# Patient Record
Sex: Female | Born: 1950 | Race: White | Hispanic: No | Marital: Single | Smoking: Former smoker
Health system: Southern US, Community
[De-identification: ages and names within clinical notes are randomized; demographics above are authoritative.]

## PROBLEM LIST (undated history)

## (undated) DIAGNOSIS — I10 Essential (primary) hypertension: Secondary | ICD-10-CM

## (undated) DIAGNOSIS — E7800 Pure hypercholesterolemia, unspecified: Secondary | ICD-10-CM

## (undated) HISTORY — PX: CHOLECYSTECTOMY: SHX55

---

## 2001-07-26 DIAGNOSIS — E669 Obesity, unspecified: Secondary | ICD-10-CM | POA: Insufficient documentation

## 2005-06-01 DIAGNOSIS — M159 Polyosteoarthritis, unspecified: Secondary | ICD-10-CM | POA: Insufficient documentation

## 2005-06-01 DIAGNOSIS — M5137 Other intervertebral disc degeneration, lumbosacral region: Secondary | ICD-10-CM

## 2005-06-01 DIAGNOSIS — E785 Hyperlipidemia, unspecified: Secondary | ICD-10-CM

## 2005-10-21 DIAGNOSIS — M949 Disorder of cartilage, unspecified: Secondary | ICD-10-CM

## 2005-10-21 DIAGNOSIS — M899 Disorder of bone, unspecified: Secondary | ICD-10-CM | POA: Insufficient documentation

## 2007-07-27 ENCOUNTER — Ambulatory Visit: Payer: Self-pay | Admitting: Nurse Practitioner

## 2007-07-27 DIAGNOSIS — I10 Essential (primary) hypertension: Secondary | ICD-10-CM

## 2007-07-27 DIAGNOSIS — R209 Unspecified disturbances of skin sensation: Secondary | ICD-10-CM | POA: Insufficient documentation

## 2007-07-31 LAB — CONVERTED CEMR LAB
AST: 17 units/L (ref 0–37)
BUN: 16 mg/dL (ref 6–23)
Basophils Relative: 1 % (ref 0–1)
Calcium: 9.1 mg/dL (ref 8.4–10.5)
Chloride: 109 meq/L (ref 96–112)
Cholesterol: 267 mg/dL — ABNORMAL HIGH (ref 0–200)
Creatinine, Ser: 0.69 mg/dL (ref 0.40–1.20)
Eosinophils Absolute: 0.3 10*3/uL (ref 0.0–0.7)
Eosinophils Relative: 4 % (ref 0–5)
HCT: 43 % (ref 36.0–46.0)
Hemoglobin: 13.8 g/dL (ref 12.0–15.0)
Lymphocytes Relative: 28 % (ref 12–46)
Monocytes Relative: 5 % (ref 3–12)
Potassium: 5.1 meq/L (ref 3.5–5.3)
RDW: 15.4 % (ref 11.5–15.5)
Sodium: 145 meq/L (ref 135–145)
Total Bilirubin: 0.4 mg/dL (ref 0.3–1.2)
Total Protein: 6.8 g/dL (ref 6.0–8.3)
Triglycerides: 244 mg/dL — ABNORMAL HIGH (ref <150)

## 2007-08-17 ENCOUNTER — Encounter (INDEPENDENT_AMBULATORY_CARE_PROVIDER_SITE_OTHER): Payer: Self-pay | Admitting: Nurse Practitioner

## 2007-09-14 ENCOUNTER — Ambulatory Visit: Payer: Self-pay | Admitting: Nurse Practitioner

## 2007-09-14 LAB — CONVERTED CEMR LAB
Cholesterol, target level: 200 mg/dL
LDL Goal: 130 mg/dL

## 2007-09-28 ENCOUNTER — Ambulatory Visit: Payer: Self-pay | Admitting: Nurse Practitioner

## 2007-10-17 ENCOUNTER — Encounter (INDEPENDENT_AMBULATORY_CARE_PROVIDER_SITE_OTHER): Payer: Self-pay | Admitting: Nurse Practitioner

## 2007-10-29 ENCOUNTER — Telehealth (INDEPENDENT_AMBULATORY_CARE_PROVIDER_SITE_OTHER): Payer: Self-pay | Admitting: Nurse Practitioner

## 2007-11-15 ENCOUNTER — Ambulatory Visit (HOSPITAL_COMMUNITY): Admission: RE | Admit: 2007-11-15 | Discharge: 2007-11-15 | Payer: Self-pay | Admitting: Internal Medicine

## 2007-11-16 ENCOUNTER — Encounter (INDEPENDENT_AMBULATORY_CARE_PROVIDER_SITE_OTHER): Payer: Self-pay | Admitting: Nurse Practitioner

## 2007-12-18 ENCOUNTER — Ambulatory Visit: Payer: Self-pay | Admitting: Nurse Practitioner

## 2007-12-18 DIAGNOSIS — Z9089 Acquired absence of other organs: Secondary | ICD-10-CM | POA: Insufficient documentation

## 2007-12-18 DIAGNOSIS — F17200 Nicotine dependence, unspecified, uncomplicated: Secondary | ICD-10-CM | POA: Insufficient documentation

## 2007-12-18 LAB — CONVERTED CEMR LAB
ALT: 19 units/L (ref 0–35)
AST: 17 units/L (ref 0–37)
Albumin: 4.2 g/dL (ref 3.5–5.2)
Alkaline Phosphatase: 88 units/L (ref 39–117)
HDL: 60 mg/dL (ref >39)
Total CHOL/HDL Ratio: 2.9
Total Protein: 7.1 g/dL (ref 6.0–8.3)
Triglycerides: 228 mg/dL — ABNORMAL HIGH (ref <150)

## 2007-12-20 ENCOUNTER — Encounter (INDEPENDENT_AMBULATORY_CARE_PROVIDER_SITE_OTHER): Payer: Self-pay | Admitting: Nurse Practitioner

## 2008-01-01 ENCOUNTER — Ambulatory Visit: Payer: Self-pay | Admitting: Nurse Practitioner

## 2008-04-11 ENCOUNTER — Encounter (INDEPENDENT_AMBULATORY_CARE_PROVIDER_SITE_OTHER): Payer: Self-pay | Admitting: Nurse Practitioner

## 2008-04-11 ENCOUNTER — Ambulatory Visit: Payer: Self-pay | Admitting: Nurse Practitioner

## 2008-04-11 DIAGNOSIS — Z780 Asymptomatic menopausal state: Secondary | ICD-10-CM | POA: Insufficient documentation

## 2008-04-11 LAB — CONVERTED CEMR LAB
Blood in Urine, dipstick: NEGATIVE
KOH Prep: NEGATIVE
Ketones, urine, test strip: NEGATIVE
Protein, U semiquant: NEGATIVE
Urobilinogen, UA: 0.2

## 2008-04-14 ENCOUNTER — Encounter (INDEPENDENT_AMBULATORY_CARE_PROVIDER_SITE_OTHER): Payer: Self-pay | Admitting: Nurse Practitioner

## 2008-04-15 LAB — CONVERTED CEMR LAB
ALT: 17 units/L (ref 0–35)
Basophils Absolute: 0 10*3/uL (ref 0.0–0.1)
Creatinine, Ser: 0.72 mg/dL (ref 0.40–1.20)
Glucose, Bld: 69 mg/dL — ABNORMAL LOW (ref 70–99)
Lymphocytes Relative: 25 % (ref 12–46)
MCV: 92.3 fL (ref 78.0–100.0)
Neutro Abs: 6.7 10*3/uL (ref 1.7–7.7)
Potassium: 5.1 meq/L (ref 3.5–5.3)
Total Bilirubin: 0.2 mg/dL — ABNORMAL LOW (ref 0.3–1.2)
Total CHOL/HDL Ratio: 3.8
Triglycerides: 266 mg/dL — ABNORMAL HIGH (ref <150)
VLDL: 53 mg/dL — ABNORMAL HIGH (ref 0–40)

## 2008-04-18 ENCOUNTER — Ambulatory Visit (HOSPITAL_COMMUNITY): Admission: RE | Admit: 2008-04-18 | Discharge: 2008-04-18 | Payer: Self-pay | Admitting: Internal Medicine

## 2008-04-18 ENCOUNTER — Encounter (INDEPENDENT_AMBULATORY_CARE_PROVIDER_SITE_OTHER): Payer: Self-pay | Admitting: Nurse Practitioner

## 2008-04-24 ENCOUNTER — Encounter: Admission: RE | Admit: 2008-04-24 | Discharge: 2008-04-24 | Payer: Self-pay | Admitting: Internal Medicine

## 2008-04-28 ENCOUNTER — Encounter (INDEPENDENT_AMBULATORY_CARE_PROVIDER_SITE_OTHER): Payer: Self-pay | Admitting: Internal Medicine

## 2008-04-29 ENCOUNTER — Encounter (INDEPENDENT_AMBULATORY_CARE_PROVIDER_SITE_OTHER): Payer: Self-pay | Admitting: Nurse Practitioner

## 2008-04-29 ENCOUNTER — Encounter (INDEPENDENT_AMBULATORY_CARE_PROVIDER_SITE_OTHER): Payer: Self-pay | Admitting: Diagnostic Radiology

## 2008-04-29 ENCOUNTER — Encounter: Admission: RE | Admit: 2008-04-29 | Discharge: 2008-04-29 | Payer: Self-pay | Admitting: Internal Medicine

## 2008-04-30 ENCOUNTER — Encounter (INDEPENDENT_AMBULATORY_CARE_PROVIDER_SITE_OTHER): Payer: Self-pay | Admitting: Nurse Practitioner

## 2008-07-11 ENCOUNTER — Ambulatory Visit: Payer: Self-pay | Admitting: Nurse Practitioner

## 2008-07-16 ENCOUNTER — Encounter (INDEPENDENT_AMBULATORY_CARE_PROVIDER_SITE_OTHER): Payer: Self-pay | Admitting: Nurse Practitioner

## 2008-07-17 ENCOUNTER — Ambulatory Visit: Payer: Self-pay | Admitting: Nurse Practitioner

## 2008-07-17 LAB — CONVERTED CEMR LAB
ALT: 19 units/L (ref 0–35)
Alkaline Phosphatase: 74 units/L (ref 39–117)
Cholesterol: 125 mg/dL (ref 0–200)
HDL: 52 mg/dL (ref >39)
Total CHOL/HDL Ratio: 2.4
Total Protein: 6.6 g/dL (ref 6.0–8.3)
Triglycerides: 169 mg/dL — ABNORMAL HIGH (ref <150)

## 2008-07-18 ENCOUNTER — Encounter (INDEPENDENT_AMBULATORY_CARE_PROVIDER_SITE_OTHER): Payer: Self-pay | Admitting: Nurse Practitioner

## 2008-08-25 ENCOUNTER — Encounter (INDEPENDENT_AMBULATORY_CARE_PROVIDER_SITE_OTHER): Payer: Self-pay | Admitting: Nurse Practitioner

## 2008-10-20 ENCOUNTER — Encounter: Admission: RE | Admit: 2008-10-20 | Discharge: 2008-11-27 | Payer: Self-pay | Admitting: Neurosurgery

## 2008-10-29 ENCOUNTER — Encounter (INDEPENDENT_AMBULATORY_CARE_PROVIDER_SITE_OTHER): Payer: Self-pay | Admitting: Nurse Practitioner

## 2008-11-24 ENCOUNTER — Ambulatory Visit: Payer: Self-pay | Admitting: Nurse Practitioner

## 2009-03-09 ENCOUNTER — Ambulatory Visit: Payer: Self-pay | Admitting: Nurse Practitioner

## 2009-03-09 ENCOUNTER — Telehealth (INDEPENDENT_AMBULATORY_CARE_PROVIDER_SITE_OTHER): Payer: Self-pay | Admitting: Nurse Practitioner

## 2009-03-25 ENCOUNTER — Telehealth (INDEPENDENT_AMBULATORY_CARE_PROVIDER_SITE_OTHER): Payer: Self-pay | Admitting: *Deleted

## 2009-03-31 ENCOUNTER — Encounter (INDEPENDENT_AMBULATORY_CARE_PROVIDER_SITE_OTHER): Payer: Self-pay | Admitting: Nurse Practitioner

## 2009-04-14 ENCOUNTER — Telehealth (INDEPENDENT_AMBULATORY_CARE_PROVIDER_SITE_OTHER): Payer: Self-pay | Admitting: Nurse Practitioner

## 2009-04-21 ENCOUNTER — Encounter: Admission: RE | Admit: 2009-04-21 | Discharge: 2009-04-21 | Payer: Self-pay | Admitting: Internal Medicine

## 2009-05-11 ENCOUNTER — Telehealth (INDEPENDENT_AMBULATORY_CARE_PROVIDER_SITE_OTHER): Payer: Self-pay | Admitting: *Deleted

## 2009-05-14 ENCOUNTER — Encounter (INDEPENDENT_AMBULATORY_CARE_PROVIDER_SITE_OTHER): Payer: Self-pay | Admitting: Nurse Practitioner

## 2009-07-30 ENCOUNTER — Ambulatory Visit: Payer: Self-pay | Admitting: Nurse Practitioner

## 2009-07-30 DIAGNOSIS — R05 Cough: Secondary | ICD-10-CM

## 2009-08-21 ENCOUNTER — Ambulatory Visit (HOSPITAL_COMMUNITY): Admission: RE | Admit: 2009-08-21 | Discharge: 2009-08-21 | Payer: Self-pay | Admitting: Nurse Practitioner

## 2009-08-24 ENCOUNTER — Encounter (INDEPENDENT_AMBULATORY_CARE_PROVIDER_SITE_OTHER): Payer: Self-pay | Admitting: Nurse Practitioner

## 2009-09-02 ENCOUNTER — Ambulatory Visit: Payer: Self-pay | Admitting: Nurse Practitioner

## 2009-09-03 LAB — CONVERTED CEMR LAB
AST: 17 units/L (ref 0–37)
Albumin: 4.1 g/dL (ref 3.5–5.2)
BUN: 12 mg/dL (ref 6–23)
Basophils Absolute: 0 10*3/uL (ref 0.0–0.1)
CO2: 25 meq/L (ref 19–32)
Calcium: 9.1 mg/dL (ref 8.4–10.5)
Chloride: 107 meq/L (ref 96–112)
Cholesterol: 122 mg/dL (ref 0–200)
Eosinophils Absolute: 0.4 10*3/uL (ref 0.0–0.7)
Glucose, Bld: 95 mg/dL (ref 70–99)
HDL: 52 mg/dL (ref >39)
Hemoglobin: 13.4 g/dL (ref 12.0–15.0)
LDL Cholesterol: 37 mg/dL (ref 0–99)
Lymphocytes Relative: 26 % (ref 12–46)
MCHC: 32.1 g/dL (ref 30.0–36.0)
MCV: 93.3 fL (ref 78.0–100.0)
Potassium: 4 meq/L (ref 3.5–5.3)
Total Bilirubin: 0.4 mg/dL (ref 0.3–1.2)
Total CHOL/HDL Ratio: 2.3
Total Protein: 6.5 g/dL (ref 6.0–8.3)
Triglycerides: 167 mg/dL — ABNORMAL HIGH (ref <150)

## 2009-09-18 ENCOUNTER — Telehealth (INDEPENDENT_AMBULATORY_CARE_PROVIDER_SITE_OTHER): Payer: Self-pay | Admitting: Nurse Practitioner

## 2009-09-23 ENCOUNTER — Telehealth (INDEPENDENT_AMBULATORY_CARE_PROVIDER_SITE_OTHER): Payer: Self-pay | Admitting: Nurse Practitioner

## 2009-10-19 ENCOUNTER — Telehealth (INDEPENDENT_AMBULATORY_CARE_PROVIDER_SITE_OTHER): Payer: Self-pay | Admitting: Nurse Practitioner

## 2009-10-26 ENCOUNTER — Telehealth (INDEPENDENT_AMBULATORY_CARE_PROVIDER_SITE_OTHER): Payer: Self-pay | Admitting: Nurse Practitioner

## 2009-11-01 ENCOUNTER — Encounter (INDEPENDENT_AMBULATORY_CARE_PROVIDER_SITE_OTHER): Payer: Self-pay | Admitting: Nurse Practitioner

## 2009-11-12 ENCOUNTER — Telehealth (INDEPENDENT_AMBULATORY_CARE_PROVIDER_SITE_OTHER): Payer: Self-pay | Admitting: Nurse Practitioner

## 2009-11-16 ENCOUNTER — Encounter (INDEPENDENT_AMBULATORY_CARE_PROVIDER_SITE_OTHER): Payer: Self-pay | Admitting: Nurse Practitioner

## 2009-12-24 ENCOUNTER — Ambulatory Visit: Payer: Self-pay | Admitting: Nurse Practitioner

## 2009-12-24 DIAGNOSIS — M79609 Pain in unspecified limb: Secondary | ICD-10-CM

## 2010-02-03 ENCOUNTER — Telehealth (INDEPENDENT_AMBULATORY_CARE_PROVIDER_SITE_OTHER): Payer: Self-pay | Admitting: *Deleted

## 2010-02-08 ENCOUNTER — Encounter (INDEPENDENT_AMBULATORY_CARE_PROVIDER_SITE_OTHER): Payer: Self-pay | Admitting: Nurse Practitioner

## 2010-02-10 ENCOUNTER — Telehealth (INDEPENDENT_AMBULATORY_CARE_PROVIDER_SITE_OTHER): Payer: Self-pay | Admitting: *Deleted

## 2010-02-11 ENCOUNTER — Encounter (INDEPENDENT_AMBULATORY_CARE_PROVIDER_SITE_OTHER): Payer: Self-pay | Admitting: Nurse Practitioner

## 2010-03-18 ENCOUNTER — Ambulatory Visit: Admit: 2010-03-18 | Payer: Self-pay | Admitting: Nurse Practitioner

## 2010-03-29 ENCOUNTER — Encounter: Payer: Self-pay | Admitting: Internal Medicine

## 2010-03-31 ENCOUNTER — Other Ambulatory Visit: Payer: Self-pay | Admitting: Internal Medicine

## 2010-03-31 DIAGNOSIS — Z1239 Encounter for other screening for malignant neoplasm of breast: Secondary | ICD-10-CM

## 2010-04-06 NOTE — Letter (Signed)
Summary: HANDICAPPED PLACARD  HANDICAPPED PLACARD   Imported By: Arta Bruce 05/14/2009 09:53:19  _____________________________________________________________________  External Attachment:    Type:   Image     Comment:   External Document

## 2010-04-06 NOTE — Assessment & Plan Note (Signed)
Summary: Left leg pain   Vital Signs:  Patient profile:   60 year old female Weight:      195.8 pounds BMI:     36.23 Temp:     97.8 degrees F oral Pulse rate:   80 / minute Pulse rhythm:   regular Resp:     20 per minute BP sitting:   140 / 90  (left arm) Cuff size:   large  Vitals Entered By: Levon Hedger (December 24, 2009 4:22 PM)  Nutrition Counseling: Patient's BMI is greater than 25 and therefore counseled on weight management options. CC: follow-up visit....pain in top of left thigh feel like it is being pulled, Hypertension Management, Lipid Management Is Patient Diabetic? No Pain Assessment Patient in pain? yes     Location: thigh Intensity: 8  Does patient need assistance? Functional Status Self care Ambulation Normal    CC:  follow-up visit....pain in top of left thigh feel like it is being pulled, Hypertension Management, and Lipid Management.  History of Present Illness:  Pt presents today for routine f/u. She brings in a copy of her chest x-ray films that she had back in 08/2009 and would like interpretation  Left leg pain - only on the left leg (right leg is not affected) "Feels like my leg is being pulled" Pt denies any change in activity Hx of pain in the back but not currently with this leg pain Pain progresses as the day goes on - daily for the past 5 years Pt has vicodin that she takes as needed for pain and particularly on yesterday there was no difference in the level of pain.  On other days it does help some with the pain.  Hypertension History:      She denies headache, chest pain, and palpitations.  She notes no problems with any antihypertensive medication side effects.        Positive major cardiovascular risk factors include female age 61 years old or older, hyperlipidemia, hypertension, and current tobacco user.  Negative major cardiovascular risk factors include no history of diabetes and negative family history for ischemic heart  disease.        Further assessment for target organ damage reveals no history of ASHD, cardiac end-organ damage (CHF/LVH), stroke/TIA, peripheral vascular disease, renal insufficiency, or hypertensive retinopathy.    Lipid Management History:      Positive NCEP/ATP III risk factors include female age 61 years old or older, current tobacco user, and hypertension.  Negative NCEP/ATP III risk factors include no history of early menopause without estrogen hormone replacement, non-diabetic, no family history for ischemic heart disease, no ASHD (atherosclerotic heart disease), no prior stroke/TIA, no peripheral vascular disease, and no history of aortic aneurysm.        The patient states that she knows about the "Therapeutic Lifestyle Change" diet.  Her compliance with the TLC diet is fair.  The patient does not know about adjunctive measures for cholesterol lowering.  She expresses no side effects from her lipid-lowering medication.  The patient denies any symptoms to suggest myopathy or liver disease.      Habits & Providers  Alcohol-Tobacco-Diet     Alcohol drinks/day: 0     Tobacco Status: current     Tobacco Counseling: to quit use of tobacco products     Cigarette Packs/Day: 0.75     Year Started: age 60  Exercise-Depression-Behavior     Does Patient Exercise: yes     Exercise Counseling: to improve exercise regimen  Type of exercise: walking     Exercise (avg: min/session): <30     Have you felt down or hopeless? no     Have you felt little pleasure in things? no     Depression Counseling: not indicated; screening negative for depression     Drug Use: no     Seat Belt Use: 100     Sun Exposure: occasionally  Allergies (verified): 1)  ! Sulfa 2)  ! Penicillin  Review of Systems CV:  Denies chest pain or discomfort. Resp:  Denies cough. GI:  Denies abdominal pain, nausea, and vomiting. MS:  Complains of joint pain. Neuro:  Complains of tingling.  Physical Exam  General:   alert.   Head:  normocephalic.   Eyes:  glasses Lungs:  normal breath sounds.   Heart:  normal rate and regular rhythm.   Abdomen:  normal bowel sounds.   Neurologic:  alert & oriented X3.   Skin:  color normal.   Psych:  Oriented X3.     Impression & Recommendations:  Problem # 1:  LEG PAIN, LEFT (ICD-729.5) ongoing issue for pt advised conservative therapy  Problem # 2:  HYPERTENSION, BENIGN ESSENTIAL (ICD-401.1) BP stable DASH diet Her updated medication list for this problem includes:    Amlodipine Besy-benazepril Hcl 10-20 Mg Caps (Amlodipine besy-benazepril hcl) .Marland Kitchen... 1 capsule by mouth daily for blood pressure  Problem # 3:  OBESITY (ICD-278.00) weight down 10 pounds since last visit here  Problem # 4:  TOBACCO ABUSE (ICD-305.1) ongoing  pt is still trying to quit  Complete Medication List: 1)  Amlodipine Besy-benazepril Hcl 10-20 Mg Caps (Amlodipine besy-benazepril hcl) .Marland Kitchen.. 1 capsule by mouth daily for blood pressure 2)  Naprosyn 500 Mg Tabs (Naproxen) .Marland Kitchen.. 1 tablet by mouth two times a day as needed 3)  Hydrocodone-acetaminophen 10-325 Mg Tabs (Hydrocodone-acetaminophen) .Marland Kitchen.. 1 tablet by mouth three times a day as needed for pain 4)  Calcium 600-d 600-400 Mg-unit Tabs (Calcium carbonate-vitamin d) .... One tablet by mouth two times a day for bones 5)  Zetia 10 Mg Tabs (Ezetimibe) .Marland Kitchen.. 1 tablet by mouth daily for cholesterol 6)  Co Q-10 120 Mg Caps (Coenzyme q10) .Marland Kitchen.. 1 tablet by mouth two times a day 7)  Colon Cleanser Caps (Misc natural products) .... 2 tablets by mouth two times a day 8)  Ginkgo Biloba 110-500-80 Mg Caps (Misc natural products) .... One tablet by mouth two times a day 9)  Boniva 150 Mg Tabs (Ibandronate sodium) .... One tablet by mouth monthly for bones 10)  Pravastatin Sodium 80 Mg Tabs (Pravastatin sodium) .... One tablet by mouth nightly for cholesterol  **pharmacy - d/c simvastatin**  Hypertension Assessment/Plan:      The patient's  hypertensive risk group is category B: At least one risk factor (excluding diabetes) with no target organ damage.  Her calculated 10 year risk of coronary heart disease is 13 %.  Today's blood pressure is 140/90.  Her blood pressure goal is < 140/90.  Lipid Assessment/Plan:      Based on NCEP/ATP III, the patient's risk factor category is "2 or more risk factors and a calculated 10 year CAD risk of < 20%".  The patient's lipid goals are as follows: Total cholesterol goal is 200; LDL cholesterol goal is 130; HDL cholesterol goal is 40; Triglyceride goal is 150.    Patient Instructions: 1)  You have declined the flu vaccine today.  If you change your mind then you can come back  for a nurse visit. 2)  Continue all your current medications. 3)  Left leg - apply a heating pad to your leg to see if that helps with the discomfort.   4)  Follow up in 3 months with n.martin,fnp for blood pressure and joints.   Orders Added: 1)  Est. Patient Level III [04540]    Prevention & Chronic Care Immunizations   Influenza vaccine: refused  (12/24/2009)   Influenza vaccine deferral: Refused  (03/09/2009)    Tetanus booster: 12/18/2007: Tdap    Pneumococcal vaccine: Not documented    H. zoster vaccine: Not documented  Colorectal Screening   Hemoccult: Not documented   Hemoccult action/deferral: NEG X 1 today  (04/11/2008)    Colonoscopy: Refused  (11/24/2008)   Colonoscopy action/deferral: Refused  (07/30/2009)  Other Screening   Pap smear:  Specimen Adequacy: Satisfactory for evaluation.   Interpretation/Result:Negative for intraepithelial Lesion or Malignancy.     (04/11/2008)   Pap smear action/deferral: PAP smear done  (04/11/2008)    Mammogram: ASSESSMENT: Negative - BI-RADS 1^MM DIGITAL SCREENING  (04/21/2009)   Mammogram action/deferral: mammogram ordered  (04/11/2008)    DXA bone density scan:  Lumbar Spine:  T Score< -2.5 Spine.     (04/18/2008)   Smoking status: current   (12/24/2009)   Smoking cessation counseling: yes  (04/11/2008)  Lipids   Total Cholesterol: 122  (09/02/2009)   LDL: 37  (09/02/2009)   LDL Direct: Not documented   HDL: 52  (09/02/2009)   Triglycerides: 167  (09/02/2009)    SGOT (AST): 17  (09/02/2009)   SGPT (ALT): 15  (09/02/2009)   Alkaline phosphatase: 80  (09/02/2009)   Total bilirubin: 0.4  (09/02/2009)  Hypertension   Last Blood Pressure: 140 / 90  (12/24/2009)   Serum creatinine: 0.70  (09/02/2009)   Serum potassium 4.0  (09/02/2009)  Self-Management Support :    Hypertension self-management support: Not documented    Lipid self-management support: Not documented

## 2010-04-06 NOTE — Medication Information (Signed)
Summary: SCRIPT FAXED TO [facility]  SCRIPT FAXED TO [facility]   Imported By: Arta Bruce 02/08/2010 15:43:42  _____________________________________________________________________  External Attachment:    Type:   Image     Comment:   External Document

## 2010-04-06 NOTE — Assessment & Plan Note (Signed)
Summary: HTN   Vital Signs:  Patient profile:   60 year old female Weight:      205.5 pounds BMI:     38.03 BSA:     1.93 Temp:     98.9 degrees F oral Pulse rate:   98 / minute Pulse rhythm:   regular Resp:     20 per minute BP sitting:   113 / 72  (left arm) Cuff size:   large  Vitals Entered By: Levon Hedger (Jul 30, 2009 4:02 PM)  Nutrition Counseling: Patient's BMI is greater than 25 and therefore counseled on weight management options. CC: follow-up visit...always has pain but not sure if it may be bone pain, Hypertension Management, Lipid Management Is Patient Diabetic? No  Does patient need assistance? Functional Status Self care Ambulation Normal   CC:  follow-up visit...always has pain but not sure if it may be bone pain, Hypertension Management, and Lipid Management.  History of Present Illness:  Pt into the office for follow up on htn and pain.  Pt presents today with all her medications.  Colonscopy - Spoke with pt on last visit about recommendations for colonscopy Pt was to review literature and inform provider when she was ready for scheduling. Pt admits that she has not thought about it very and is not interested.  Hypertension History:      She denies headache, chest pain, and palpitations.  She notes no problems with any antihypertensive medication side effects.  pt is taking meds as ordered.        Positive major cardiovascular risk factors include female age 8 years old or older, hyperlipidemia, hypertension, and current tobacco user.  Negative major cardiovascular risk factors include no history of diabetes and negative family history for ischemic heart disease.        Further assessment for target organ damage reveals no history of ASHD, cardiac end-organ damage (CHF/LVH), stroke/TIA, peripheral vascular disease, renal insufficiency, or hypertensive retinopathy.    Lipid Management History:      Positive NCEP/ATP III risk factors include female  age 60 years old or older, current tobacco user, and hypertension.  Negative NCEP/ATP III risk factors include no history of early menopause without estrogen hormone replacement, non-diabetic, no family history for ischemic heart disease, no ASHD (atherosclerotic heart disease), no prior stroke/TIA, no peripheral vascular disease, and no history of aortic aneurysm.        The patient states that she knows about the "Therapeutic Lifestyle Change" diet.  Her compliance with the TLC diet is fair.      Habits & Providers  Alcohol-Tobacco-Diet     Alcohol drinks/day: 0     Tobacco Status: current     Tobacco Counseling: to quit use of tobacco products     Cigarette Packs/Day: 0.75     Year Started: age 81  Exercise-Depression-Behavior     Does Patient Exercise: yes     Exercise Counseling: to improve exercise regimen     Type of exercise: walking     Exercise (avg: min/session): <30     Depression Counseling: not indicated; screening negative for depression     Drug Use: no     Seat Belt Use: 100     Sun Exposure: occasionally  Comments: Pt did take chantix for 1 month (January) and she quit smoking but she restarted about 1 month ago after peer pressure from her son.  She is interested in restarting the chantix in the attempt to stop smoking.  Allergies (  verified): 1)  ! Sulfa 2)  ! Penicillin  Review of Systems CV:  Denies chest pain or discomfort. Resp:  Denies cough. GI:  Denies abdominal pain, nausea, and vomiting. MS:  Complains of joint pain.  Physical Exam  General:  alert.   Head:  normocephalic.  long stringy hair Eyes:  glasses Lungs:  normal breath sounds.   Heart:  normal rate and regular rhythm.   Abdomen:  normal bowel sounds.   Msk:  normal ROM.   Neurologic:  alert & oriented X3.   Psych:  Oriented X3.     Impression & Recommendations:  Problem # 1:  HYPERTENSION, BENIGN ESSENTIAL (ICD-401.1) BP is stable. reinforced DASH diet Her updated medication  list for this problem includes:    Amlodipine Besy-benazepril Hcl 10-20 Mg Caps (Amlodipine besy-benazepril hcl) .Marland Kitchen... 1 capsule by mouth daily for blood pressure  Problem # 2:  OSTEOARTHRITIS, MULTIPLE JOINTS (ICD-715.89)  Her updated medication list for this problem includes:    Naprosyn 500 Mg Tabs (Naproxen) .Marland Kitchen... 1 tablet by mouth two times a day as needed    Hydrocodone-acetaminophen 10-325 Mg Tabs (Hydrocodone-acetaminophen) .Marland Kitchen... 1 tablet by mouth three times a day as needed for pain  Problem # 3:  HYPERLIPIDEMIA (ICD-272.4) pt need fasing labs advised pt to return in 1 month for recheck Her updated medication list for this problem includes:    Simvastatin 40 Mg Tabs (Simvastatin) .Marland Kitchen... 1 tablet by mouth nightly for cholesterol    Zetia 10 Mg Tabs (Ezetimibe) .Marland Kitchen... 1 tablet by mouth daily for cholesterol  Problem # 4:  TOBACCO ABUSE (ICD-305.1)  ongoing  pt admits that she was doing well until peer pressure from her live in son started her back advised pt that she really needs to quit smoking Her updated medication list for this problem includes:    Chantix Starting Month Pak 0.5 Mg X 11 & 1 Mg X 42 Tabs (Varenicline tartrate) .Marland Kitchen... Take as directed according to starter pack instructions  Orders: CXR- 2view (CXR)  Problem # 5:  OBESITY (ICD-278.00) pt has lost 2 pounds since her last visit. she is not doing any meaningful exercise but is trying to decrease portions  Problem # 6:  COUGH (ICD-786.2)  Orders: CXR- 2view (CXR)  Problem # 7:  OSTEOPENIA (ICD-733.90)  Her updated medication list for this problem includes:    Calcium 600-d 600-400 Mg-unit Tabs (Calcium carbonate-vitamin d) ..... One tablet by mouth two times a day for bones    Boniva 150 Mg Tabs (Ibandronate sodium) ..... One tablet by mouth monthly for bones  Complete Medication List: 1)  Amlodipine Besy-benazepril Hcl 10-20 Mg Caps (Amlodipine besy-benazepril hcl) .Marland Kitchen.. 1 capsule by mouth daily for blood  pressure 2)  Naprosyn 500 Mg Tabs (Naproxen) .Marland Kitchen.. 1 tablet by mouth two times a day as needed 3)  Hydrocodone-acetaminophen 10-325 Mg Tabs (Hydrocodone-acetaminophen) .Marland Kitchen.. 1 tablet by mouth three times a day as needed for pain 4)  Simvastatin 40 Mg Tabs (Simvastatin) .Marland Kitchen.. 1 tablet by mouth nightly for cholesterol 5)  Calcium 600-d 600-400 Mg-unit Tabs (Calcium carbonate-vitamin d) .... One tablet by mouth two times a day for bones 6)  Zetia 10 Mg Tabs (Ezetimibe) .Marland Kitchen.. 1 tablet by mouth daily for cholesterol 7)  Co Q-10 120 Mg Caps (Coenzyme q10) .Marland Kitchen.. 1 tablet by mouth two times a day 8)  Colon Cleanser Caps (Misc natural products) .... 2 tablets by mouth two times a day 9)  Ginkgo Biloba 110-500-80 Mg Caps (Misc  natural products) .... One tablet by mouth two times a day 10)  Chantix Starting Month Pak 0.5 Mg X 11 & 1 Mg X 42 Tabs (Varenicline tartrate) .... Take as directed according to starter pack instructions 11)  Boniva 150 Mg Tabs (Ibandronate sodium) .... One tablet by mouth monthly for bones  Hypertension Assessment/Plan:      The patient's hypertensive risk group is category B: At least one risk factor (excluding diabetes) with no target organ damage.  Her calculated 10 year risk of coronary heart disease is 6 %.  Today's blood pressure is 113/72.  Her blood pressure goal is < 140/90.  Lipid Assessment/Plan:      Based on NCEP/ATP III, the patient's risk factor category is "2 or more risk factors and a calculated 10 year CAD risk of < 20%".  The patient's lipid goals are as follows: Total cholesterol goal is 200; LDL cholesterol goal is 130; HDL cholesterol goal is 40; Triglyceride goal is 150.    Patient Instructions: 1)  Schedule an appointment for fasting labs - lipids/cmp, tsh, cbc, 2)  for 3 weeks.  Ask for chantix prescription 3)  Do not eat after midnight before this visit.  May drink water. 4)  Follow up in 4 months with n.martin,fnp Prescriptions: BONIVA 150 MG TABS  (IBANDRONATE SODIUM) One tablet by mouth monthly for bones  #1 x 11   Entered and Authorized by:   Lehman Prom FNP   Signed by:   Lehman Prom FNP on 07/30/2009   Method used:   Print then Give to Patient   RxID:   [id number] CHANTIX STARTING MONTH PAK 0.5 MG X 11 & 1 MG X 42 TABS (VARENICLINE TARTRATE) Take as directed according to starter pack instructions  #1 month qs x 0   Entered and Authorized by:   Lehman Prom FNP   Signed by:   Lehman Prom FNP on 07/30/2009   Method used:   Print then Give to Patient   RxID:   [id number]   Prevention & Chronic Care Immunizations   Influenza vaccine: declined  (12/18/2007)   Influenza vaccine deferral: Refused  (03/09/2009)    Tetanus booster: 12/18/2007: Tdap    Pneumococcal vaccine: Not documented    H. zoster vaccine: Not documented  Colorectal Screening   Hemoccult: Not documented   Hemoccult action/deferral: NEG X 1 today  (04/11/2008)    Colonoscopy: Refused  (11/24/2008)   Colonoscopy action/deferral: Refused  (07/30/2009)  Other Screening   Pap smear:  Specimen Adequacy: Satisfactory for evaluation.   Interpretation/Result:Negative for intraepithelial Lesion or Malignancy.     (04/11/2008)   Pap smear action/deferral: PAP smear done  (04/11/2008)    Mammogram: ASSESSMENT: Negative - BI-RADS 1^MM DIGITAL SCREENING  (04/21/2009)   Mammogram action/deferral: mammogram ordered  (04/11/2008)    DXA bone density scan:  Lumbar Spine:  T Score< -2.5 Spine.     (04/18/2008)   Smoking status: current  (07/30/2009)   Smoking cessation counseling: yes  (04/11/2008)  Lipids   Total Cholesterol: 125  (07/17/2008)   LDL: 39  (07/17/2008)   LDL Direct: Not documented   HDL: 52  (07/17/2008)   Triglycerides: 169  (07/17/2008)    SGOT (AST): 19  (07/17/2008)   SGPT (ALT): 19  (07/17/2008)   Alkaline phosphatase: 74  (07/17/2008)   Total bilirubin: 0.4  (07/17/2008)  Hypertension   Last Blood Pressure:  113 / 72  (07/30/2009)   Serum creatinine: 0.72  (04/11/2008)   Serum potassium  5.1  (04/11/2008)  Self-Management Support :    Hypertension self-management support: Not documented    Lipid self-management support: Not documented

## 2010-04-06 NOTE — Medication Information (Signed)
Summary: [facility] /  Faxed  [facility] /  Faxed   Imported By: Arta Bruce 02/11/2010 11:37:53  _____________________________________________________________________  External Attachment:    Type:   Image     Comment:   External Document

## 2010-04-06 NOTE — Progress Notes (Signed)
Summary: Refill of naprosyn  Phone Note Outgoing Call   Summary of Call: Do you still want her Naprosyn scheduled as prn?   We just refilled this 7/14 for #60 to be taken twice a day as needed, and it's up for refill again.  Just a query - seems that she's taken the Naprosyn two times a day scheduled.  Also, do you only want this refilled x1 only? Initial call taken by: Dutch Quint RN,  October 19, 2009 3:45 PM  Follow-up for Phone Call        yes, still want it ordered as needed but she has been instructed to take it in between her narcotics which is why she has probably been taking it so routinely. ok to fill for 4 refiils Follow-up by: Lehman Prom FNP,  October 20, 2009 8:07 AM  Additional Follow-up for Phone Call Additional follow up Details #1::        Noted.  Dutch Quint RN  October 20, 2009 9:43 AM

## 2010-04-06 NOTE — Progress Notes (Signed)
Summary: Medications side effects  Phone Note Call from Patient   Summary of Call: Tammy Kirby is at the lobby because the pharmacy told her that these two meeidation causes some side effects (amlodipine/ and simvastatin) Daphine Deutscher FNP Initial call taken by: Manon Hilding,  September 18, 2009 3:26 PM  Follow-up for Phone Call        according to pt she went to pick up her medication from the [facility] and it was a note stating for the pt to have her provider to counsel her on side effects with amlodipine and simvastatin being used together. I made a copy of note and placed on your desk. Follow-up by: Levon Hedger,  September 18, 2009 4:38 PM  Additional Follow-up for Phone Call Additional follow up Details #1::        Notify pt that there is a recent update regarding simvastatin and amlodipine There is a warning against taking doses of simvastatin greater than 20mg  with amlodipine as it may cause muscle weakness Pt is taking simvastatin 40mg  so would like to change her to another medication as she was not adequately controlled with simvastatin 20mg  so to decrease back to that dose is likely not going to be effective. It pt agrees to the medication change will d/c simvastatin and another cholesterol medication. i am aware that she is also taking zetia which alone is not effective  Additional Follow-up by: Lehman Prom FNP,  September 22, 2009 1:26 PM    Additional Follow-up for Phone Call Additional follow up Details #2::    Pt. agrees to the medication changes as suggested.   Follow-up by: Dutch Quint RN,  September 22, 2009 4:31 PM  Additional Follow-up for Phone Call Additional follow up Details #3:: Details for Additional Follow-up Action Taken: Pt to continue zetia 10mg  by mouth daily  STOP simvastatin Start pravastatin 80mg  by mouth nightly ([facility]) Continue blood pressure medications as previously ordered n.martin,fnp  September 23, 2009 8:24 AM  Pt. made aware of  medication changes and new Rx -- verbalizes understanding.   Additional Follow-up by: Dutch Quint RN,  September 23, 2009 10:54 AM  New/Updated Medications: PRAVASTATIN SODIUM 80 MG TABS (PRAVASTATIN SODIUM) One tablet by mouth nightly for cholesterol  **Pharmacy - D/C simvastatin** Prescriptions: PRAVASTATIN SODIUM 80 MG TABS (PRAVASTATIN SODIUM) One tablet by mouth nightly for cholesterol  **Pharmacy - D/C simvastatin**  #30 x 5   Entered and Authorized by:   Lehman Prom FNP   Signed by:   Lehman Prom FNP on 09/23/2009   Method used:   Electronically to        [facility]  [address] [phone]* (retail)       [address]       [place], [place]  [address]       Ph: [phone]       Fax: [phone]   RxID:   [id number]

## 2010-04-06 NOTE — Progress Notes (Signed)
Summary: DROPPED OFF FORM FOR RX  Phone Note Call from Patient Call back at Home Phone [phone]   Reason for Call: Refill Medication Summary of Call: MARTIN PT. MS Ruzich DROPPED OF HER [facility] MAIL ORDER FORM FOR HER AMLODIPINE/BENAZEPRIL TO BE FAXED ALONG WITH THE RX. FORM IS IN [facility] REFILL SHELF Initial call taken by: Leodis Rains,  February 03, 2010 3:40 PM  Follow-up for Phone Call        form completed  on shelf to fax back Follow-up by: Lehman Prom FNP,  February 08, 2010 2:12 PM  Additional Follow-up for Phone Call Additional follow up Details #1::        FAXED FORM AND SCANNED INTO EMR Additional Follow-up by: Arta Bruce,  February 08, 2010 3:39 PM

## 2010-04-06 NOTE — Progress Notes (Signed)
Summary: Pain meds  Phone Note Outgoing Call   Summary of Call: Will fill pain medication on 05/13/2009 and fax to [facility]  Initial call taken by: Lehman Prom FNP,  May 11, 2009 2:36 PM  Follow-up for Phone Call        [employer] STOPPED BY AND DROPPED OFF A HANDICAPP APPLICATION TO BE FILLED OUT. Follow-up by: Leodis Rains,  May 11, 2009 4:32 PM  Additional Follow-up for Phone Call Additional follow up Details #1::        Rx faxed to [facility] [address] Levon Hedger  May 12, 2009 10:49 AM     Additional Follow-up for Phone Call Additional follow up Details #2::    notify pt that one of the forms completed - which was the one she put her information in she can pick up at her convience Follow-up by: Lehman Prom FNP,  May 13, 2009 7:12 PM  Additional Follow-up for Phone Call Additional follow up Details #3:: Details for Additional Follow-up Action Taken: CALLED PT READY FOR PICK UP Additional Follow-up by: Arta Bruce,  May 14, 2009 9:58 AM

## 2010-04-06 NOTE — Assessment & Plan Note (Signed)
Summary: HTN/Back Pain   Vital Signs:  Patient profile:   60 year old female Weight:      207.9 pounds BMI:     38.47 BSA:     1.94 Temp:     97.9 degrees F oral Pulse rate:   96 / minute Pulse rhythm:   regular Resp:     20 per minute BP sitting:   111 / 75  (left arm) Cuff size:   large  Vitals Entered By: Levon Hedger (March 09, 2009 4:23 PM) CC: follow-up visit, Hypertension Management, Lipid Management, Back Pain Is Patient Diabetic? No Pain Assessment Patient in pain? no       Does patient need assistance? Functional Status Self care Ambulation Normal   CC:  follow-up visit, Hypertension Management, Lipid Management, and Back Pain.  History of Present Illness:  Pt into the office for 3 month follow up - htn. Pt has all her medications with her into the office and she is on MULTIPLE herbal medications. Reviewed all these medications with pt and withdrew the ones she were taking duplicates since they were included in her multivitamin.  Back Pain History:            Other comments:  advised pt that she should take medications as ordered. She should take hydrocodone as needed for pain.    Hypertension History:      She denies headache, chest pain, and palpitations.  She notes no problems with any antihypertensive medication side effects.  Pt is taking her medications as ordered.        Positive major cardiovascular risk factors include female age 45 years old or older, hyperlipidemia, hypertension, and current tobacco user.  Negative major cardiovascular risk factors include no history of diabetes and negative family history for ischemic heart disease.        Further assessment for target organ damage reveals no history of ASHD, cardiac end-organ damage (CHF/LVH), stroke/TIA, peripheral vascular disease, renal insufficiency, or hypertensive retinopathy.    Lipid Management History:      Positive NCEP/ATP III risk factors include female age 51 years old or older,  current tobacco user, and hypertension.  Negative NCEP/ATP III risk factors include no history of early menopause without estrogen hormone replacement, non-diabetic, no family history for ischemic heart disease, no ASHD (atherosclerotic heart disease), no prior stroke/TIA, no peripheral vascular disease, and no history of aortic aneurysm.        The patient states that she knows about the "Therapeutic Lifestyle Change" diet.  Her compliance with the TLC diet is fair.  The patient does not know about adjunctive measures for cholesterol lowering.  She expresses no side effects from her lipid-lowering medication.  Comments include: Pt is taking zetia and simvastatin as ordered.  The patient denies any symptoms to suggest myopathy or liver disease.       Habits & Providers  Alcohol-Tobacco-Diet     Alcohol drinks/day: 0     Tobacco Status: current     Tobacco Counseling: to quit use of tobacco products     Cigarette Packs/Day: 0.75     Year Started: age 37  Exercise-Depression-Behavior     Does Patient Exercise: yes     Exercise Counseling: to improve exercise regimen     Type of exercise: walking     Exercise (avg: min/session): <30     Have you felt down or hopeless? no     Have you felt little pleasure in things? no  Depression Counseling: not indicated; screening negative for depression     Drug Use: no     Seat Belt Use: 100     Sun Exposure: occasionally  Medications Prior to Update: 1)  Amlodipine Besy-Benazepril Hcl 10-20 Mg  Caps (Amlodipine Besy-Benazepril Hcl) .Marland Kitchen.. 1 Capsule By Mouth Daily For Blood Pressure 2)  Naprosyn 500 Mg  Tabs (Naproxen) .Marland Kitchen.. 1 Tablet By Mouth Two Times A Day As Needed 3)  Hydrocodone-Acetaminophen 10-325 Mg  Tabs (Hydrocodone-Acetaminophen) .Marland Kitchen.. 1 Tablet By Mouth Three Times A Day As Needed For Pain 4)  Simvastatin 40 Mg  Tabs (Simvastatin) .Marland Kitchen.. 1 Tablet By Mouth Nightly For Cholesterol 5)  Neurontin 300 Mg Caps (Gabapentin) .... Taper To 1 Capsule  For 1 Week Then Off 6)  Calcium 600-D 600-400 Mg-Unit Tabs (Calcium Carbonate-Vitamin D) .... One Tablet By Mouth Two Times A Day For Bones 7)  Zetia 10 Mg Tabs (Ezetimibe) .Marland Kitchen.. 1 Tablet By Mouth Daily For Cholesterol 8)  Co Q-10 120 Mg Caps (Coenzyme Q10) .Marland Kitchen.. 1 Tablet By Mouth Two Times A Day 9)  Colon Cleanser  Caps (Misc Natural Products) .... 2 Tablets By Mouth Two Times A Day  Allergies (verified): 1)  ! Sulfa 2)  ! Penicillin  Review of Systems CV:  Denies chest pain or discomfort. Resp:  Denies cough. GI:  Denies abdominal pain, nausea, and vomiting. MS:  Complains of low back pain.  Physical Exam  General:  alert.  obese long stringy hair Head:  normocephalic.   Eyes:  glasses Lungs:  normal breath sounds.   Heart:  normal rate and regular rhythm.   Abdomen:  soft, non-tender, and normal bowel sounds.   Neurologic:  alert & oriented X3.   Skin:  color normal.   Psych:  Oriented X3.     Impression & Recommendations:  Problem # 1:  HYPERTENSION, BENIGN ESSENTIAL (ICD-401.1) BP is doing well continue the DASH diet Her updated medication list for this problem includes:    Amlodipine Besy-benazepril Hcl 10-20 Mg Caps (Amlodipine besy-benazepril hcl) .Marland Kitchen... 1 capsule by mouth daily for blood pressure  Problem # 2:  DISC DISEASE, LUMBAR (ICD-722.52) continue to take pain medications  Problem # 3:  SCREENING, COLON CANCER (ICD-V76.51) will refer for screening colonscopy Orders: Gastroenterology Referral (GI)  Problem # 4:  TOBACCO ABUSE (ICD-305.1)  pt would like to start chantix for smoking cessation  Her updated medication list for this problem includes:    Chantix Starting Month Pak 0.5 Mg X 11 & 1 Mg X 42 Tabs (Varenicline tartrate) .Marland Kitchen... Take as directed according to starter pack instructions  Complete Medication List: 1)  Amlodipine Besy-benazepril Hcl 10-20 Mg Caps (Amlodipine besy-benazepril hcl) .Marland Kitchen.. 1 capsule by mouth daily for blood pressure 2)   Naprosyn 500 Mg Tabs (Naproxen) .Marland Kitchen.. 1 tablet by mouth two times a day as needed 3)  Hydrocodone-acetaminophen 10-325 Mg Tabs (Hydrocodone-acetaminophen) .Marland Kitchen.. 1 tablet by mouth three times a day as needed for pain 4)  Simvastatin 40 Mg Tabs (Simvastatin) .Marland Kitchen.. 1 tablet by mouth nightly for cholesterol 5)  Calcium 600-d 600-400 Mg-unit Tabs (Calcium carbonate-vitamin d) .... One tablet by mouth two times a day for bones 6)  Zetia 10 Mg Tabs (Ezetimibe) .Marland Kitchen.. 1 tablet by mouth daily for cholesterol 7)  Co Q-10 120 Mg Caps (Coenzyme q10) .Marland Kitchen.. 1 tablet by mouth two times a day 8)  Colon Cleanser Caps (Misc natural products) .... 2 tablets by mouth two times a day 9)  Ginkgo Biloba  110-500-80 Mg Caps (Misc natural products) .... One tablet by mouth two times a day 10)  Chantix Starting Month Pak 0.5 Mg X 11 & 1 Mg X 42 Tabs (Varenicline tartrate) .... Take as directed according to starter pack instructions  Hypertension Assessment/Plan:      The patient's hypertensive risk group is category B: At least one risk factor (excluding diabetes) with no target organ damage.  Her calculated 10 year risk of coronary heart disease is 5 %.  Today's blood pressure is 111/75.  Her blood pressure goal is < 140/90.  Lipid Assessment/Plan:      Based on NCEP/ATP III, the patient's risk factor category is "2 or more risk factors and a calculated 10 year CAD risk of < 20%".  The patient's lipid goals are as follows: Total cholesterol goal is 200; LDL cholesterol goal is 130; HDL cholesterol goal is 40; Triglyceride goal is 150.    Patient Instructions: 1)  You have declined the flu vaccine.  If you change your mind then you can get the vaccine. 2)  You will be scheduled for a consultation with the Gastroenterology. 3)  Chantix has been sent to the pharmacy. You can pick them up from the pharmacy 4)  You are taking a lot of over the counter medications, some are duplicates. Evaluate if you really need to take ALL of  them 5)  Follow up in 3 months or sooner if you have any problems Prescriptions: CHANTIX STARTING MONTH PAK 0.5 MG X 11 & 1 MG X 42 TABS (VARENICLINE TARTRATE) Take as directed according to starter pack instructions  #1 month qs x 0   Entered and Authorized by:   Lehman Prom FNP   Signed by:   Lehman Prom FNP on 03/09/2009   Method used:   [facility]        [facility] [address] [phone][facility] ([facility]ail[facility]       [address]       [place], [place]  [address]       Ph: [phone]       Fax: [phone]   RxID:   [id number]   Prevention & Chronic Care Immunizations   Influenza vaccine: declined  (12/18/2007)   Influenza vaccine deferral: Refused  (03/09/2009)    Tetanus booster: 12/18/2007: Tdap    Pneumococcal vaccine: Not documented  Colorectal Screening   Hemoccult: Not documented   Hemoccult action/deferral: NEG X 1 today  (04/11/2008)    Colonoscopy: Refused  (11/24/2008)  Other Screening   Pap smear:  Specimen Adequacy: Satisfactory for evaluation.   Interpretation/Result:Negative for intraepithelial Lesion or Malignancy.     (04/11/2008)   Pap smear action/deferral: PAP smear done  (04/11/2008)    Mammogram: 46270.3^JK BREAST SURGICAL SPECIMEN  (04/29/2008)   Mammogram action/deferral: mammogram ordered  (04/11/2008)   Smoking status: current  (03/09/2009)   Smoking cessation counseling: yes  (04/11/2008)  Lipids   Total Cholesterol: 125  (07/17/2008)   LDL: 39  (07/17/2008)   LDL Direct: Not documented   HDL: 52  (07/17/2008)   Triglycerides: 169  (07/17/2008)    SGOT (AST): 19  (07/17/2008)   SGPT (ALT): 19  (07/17/2008)   Alkaline phosphatase: 74  (07/17/2008)   Total bilirubin: 0.4  (07/17/2008)  Hypertension   Last Blood Pressure: 111 / 75  (03/09/2009)   Serum creatinine: 0.72  (04/11/2008)   Serum potassium 5.1  (04/11/2008)  Self-Management Support :  Hypertension self-management support: Not documented     Lipid self-management support: Not documented

## 2010-04-06 NOTE — Letter (Signed)
Summary: *HSN Results Follow up  [facility]  [address] [place], [place] [address]   Phone: [phone]  Fax: [phone]      08/24/2009   [facility] [address] [place], [place]  [address]   Dear  Ms. Desteni Canale,                            ____S.Drinkard,FNP   ____D. Gore,FNP       ____B. McPherson,MD   ____V. Rankins,MD    ____E. Mulberry,MD    __X__N. Daphine Deutscher, FNP  ____D. Reche Dixon, MD    ____K. Philipp Deputy, MD    ____Other     This letter is to inform you that your recent test(s):  _______Pap Smear    _______Lab Test     ___X____X-ray    ___X____ is within acceptable limits  _______ requires a medication change  _______ requires a follow-up lab visit  _______ requires a follow-up visit with your provider   Comments: Chest X-ray results are ok.       _________________________________________________________ If you have any questions, please contact our office [phone].                    Sincerely,    Lehman Prom FNP [facility]

## 2010-04-06 NOTE — Progress Notes (Signed)
Summary: MEDS  Phone Note Call from Patient   Reason for Call: Refill Medication Summary of Call: PT WANTS TO KNOW IF YOU CAN MAIL ORDER HER PAIN MEDS TOO LIKE HTE NAPROXEN TABS  Initial call taken by: Oscar La,  November 12, 2009 12:14 PM  Follow-up for Phone Call        forward to N. Daphine Deutscher, fnp Follow-up by: Levon Hedger,  November 12, 2009 12:17 PM  Additional Follow-up for Phone Call Additional follow up Details #1::        No.  My experience is that the mail order meds are sometimes left unattended which means any can have access to them if she is not at home. To get a 3 month supply of her narcotics is also problematic. Prefer she get that one locally Additional Follow-up by: Lehman Prom FNP,  November 16, 2009 2:05 PM    Additional Follow-up for Phone Call Additional follow up Details #2::    pt aware Follow-up by: Michelle Nasuti,  November 16, 2009 2:17 PM

## 2010-04-06 NOTE — Progress Notes (Signed)
Summary: DROPPED OFF [facility] REFILL FORMS  Phone Note Call from Patient Call back at Home Phone [phone]   Reason for Call: Refill Medication Summary of Call: MARTIN PT. MS Glockner DROPPED OFF HER 3 RX FORMS TO BE REFILLED AND FAXED BACK TO [facility]. THEY ARE IN A OUTGUIDE ON YOUR REFILL SHELF. Initial call taken by: Leodis Rains,  February 10, 2010 12:24 PM  Follow-up for Phone Call        forms completed fax back Follow-up by: Lehman Prom FNP,  February 10, 2010 1:27 PM  Additional Follow-up for Phone Call Additional follow up Details #1::        Faxed & Scanned into EMR Additional Follow-up by: Arta Bruce,  February 11, 2010 11:38 AM

## 2010-04-06 NOTE — Progress Notes (Signed)
Summary: MEDS   Phone Note Call from Patient   Caller: Patient Reason for Call: Refill Medication Summary of Call: PT IS GETTING HER MEDS FROM A DRUG COMPANY THAT DELIVERY TO HER HOUSE AND SHE RECEIVE A LETTER THAT THEY ARE MISSING THE DOSE . [phone] AND PT # [phone]  THANK YOU   Initial call taken by: Cheryll Dessert,  October 26, 2009 2:16 PM  Follow-up for Phone Call        Will contact med company to complete prescription instructions so that she can get her meds. Follow-up by: Dutch Quint RN,  October 26, 2009 3:25 PM

## 2010-04-06 NOTE — Progress Notes (Signed)
Summary: TAX FORM DROPPED OFF  Phone Note Call from Patient Call back at Home Phone [phone]   Summary of Call: Tammy Kirby PT. MS Tammy Kirby DROPPED OFF A PROPERTY TAX EXCLUSION FORM TO BE FILLED OUT. THIS IS TO SHOW THAT SHE IS DISABLED SO SHE GETS A TAX EXCLUSION ON HER HOME.  Initial call taken by: Leodis Rains,  March 25, 2009 11:24 AM  Follow-up for Phone Call        form completed  make copy for chart. notify pt to come pick up Follow-up by: Lehman Prom FNP,  March 30, 2009 5:32 PM  Additional Follow-up for Phone Call Additional follow up Details #1::        CALLED PT//READY TO PICK UP Additional Follow-up by: Arta Bruce,  March 31, 2009 9:42 AM

## 2010-04-06 NOTE — Miscellaneous (Signed)
Summary: Naprosyn refill  Clinical Lists Changes  Medications: Rx of NAPROSYN 500 MG  TABS (NAPROXEN) 1 tablet by mouth two times a day as needed;  #180 x 1;  Signed;  Entered by: Lehman Prom FNP;  Authorized by: Lehman Prom FNP;  Method used: Electronically to [facility]*, , ,   , Ph: [phone], Fax: [phone]    Prescriptions: NAPROSYN 500 MG  TABS (NAPROXEN) 1 tablet by mouth two times a day as needed  #180 x 1   Entered and Authorized by:   Lehman Prom FNP   Signed by:   Lehman Prom FNP on 11/16/2009   Method used:   Electronically to        [facility] (retail)             ,          Ph: [phone]       Fax: [phone]   RxID:   [id number]

## 2010-04-06 NOTE — Medication Information (Signed)
Summary: RX Folder//[facility]/NAPROXEN  RX Folder//[facility]/NAPROXEN   Imported By: Arta Bruce 11/19/2009 14:18:47  _____________________________________________________________________  External Attachment:    Type:   Image     Comment:   External Document

## 2010-04-06 NOTE — Letter (Signed)
Summary: CERTIFICATION OF DISABILITY  CERTIFICATION OF DISABILITY   Imported By: Arta Bruce 03/31/2009 09:39:04  _____________________________________________________________________  External Attachment:    Type:   Image     Comment:   External Document

## 2010-04-06 NOTE — Progress Notes (Signed)
Summary: Pain meds Refill  Phone Note Outgoing Call   Summary of Call: recieved request for pain meds. Will not fill until 04-16-2009 One month supply filled on 03/17/2009 Initial call taken by: Lehman Prom FNP,  April 14, 2009 8:44 AM  Follow-up for Phone Call        Rx printed.  To be faxed to the pharmacy on 04-16-2009 which is the due date Follow-up by: Lehman Prom FNP,  April 15, 2009 12:36 PM    Prescriptions: HYDROCODONE-ACETAMINOPHEN 10-325 MG  TABS (HYDROCODONE-ACETAMINOPHEN) 1 tablet by mouth three times a day as needed for pain  #90 x 0   Entered and Authorized by:   Lehman Prom FNP   Signed by:   Lehman Prom FNP on 04/15/2009   Method used:   Printed then [facility]ed to ...       [facility] [address] [id number]       [address]       [place], [place]  [address]       Ph: [phone]       Fax: [phone]   RxID:   [id number]

## 2010-04-06 NOTE — Progress Notes (Signed)
Summary: Pain meds  Phone Note From Pharmacy   [facility] [address] [id number] Summary of Call: Request for Hydrocodone--not due until the 20th--will leave for primary to evaluate on MOnday Initial call taken by: Julieanne Manson MD,  September 18, 2009 1:27 PM  Follow-up for Phone Call        notify pt that pain meds not due until 09/23/2009 provider will fill on 09/23/2009 [facility] Follow-up by: Lehman Prom FNP,  September 21, 2009 8:22 AM  Additional Follow-up for Phone Call Additional follow up Details #1::        Left message on answering machine to return call.  Dutch Quint RN  September 22, 2009 9:56 AM     Additional Follow-up for Phone Call Additional follow up Details #2::    Advised of provider's response for refill.   Follow-up by: Dutch Quint RN,  September 22, 2009 4:44 PM

## 2010-04-06 NOTE — Progress Notes (Signed)
Summary: FYI: appt at 4pm 03/09/09  Phone Note Call from Patient   Complaint: Headache Summary of Call: daughter-in-law called(Carrie Renae Gloss) stating that Ms Gola Bribiesca has an appt with Ms Daphine Deutscher today at 4pm and she wanted the provider to know that she passed out the other day and she wanted you to be aware just in case the patient forgot to say something about it.Marland KitchenMarland KitchenDid advised we would forward information to provider. Initial call taken by: Mikey College CMA,  March 09, 2009 2:54 PM  Follow-up for Phone Call        will review with pt today during office visit Follow-up by: Lehman Prom FNP,  March 09, 2009 5:15 PM

## 2010-04-06 NOTE — Progress Notes (Signed)
Summary: Hydrocodone refill  Phone Note Call from Patient   Caller: Patient Summary of Call: States that she would like her Hydrocodone refilled today, so that she can pick up all of her meds at once. Initial call taken by: Dutch Quint RN,  September 23, 2009 10:55 AM  Follow-up for Phone Call        done. Shiela to fax to pharmacy Follow-up by: Lehman Prom FNP,  September 23, 2009 11:51 AM  Additional Follow-up for Phone Call Additional follow up Details #1::        Pt. advised of refill.   Additional Follow-up by: Dutch Quint RN,  September 23, 2009 2:18 PM    Prescriptions: HYDROCODONE-ACETAMINOPHEN 10-325 MG  TABS (HYDROCODONE-ACETAMINOPHEN) 1 tablet by mouth three times a day as needed for pain  #90 x 0   Entered and Authorized by:   Lehman Prom FNP   Signed by:   Lehman Prom FNP on 09/23/2009   Method used:   Printed then faxed to ...       [facility] [id number]       [address]       [place], [place]  [address]       Ph: [phone]       Fax: [phone]   RxID:   [id number]

## 2010-04-22 ENCOUNTER — Ambulatory Visit: Payer: Self-pay

## 2010-04-27 ENCOUNTER — Other Ambulatory Visit (HOSPITAL_COMMUNITY): Payer: Self-pay | Admitting: Nurse Practitioner

## 2010-04-29 ENCOUNTER — Encounter (INDEPENDENT_AMBULATORY_CARE_PROVIDER_SITE_OTHER): Payer: Self-pay | Admitting: Nurse Practitioner

## 2010-04-29 ENCOUNTER — Encounter: Payer: Self-pay | Admitting: Nurse Practitioner

## 2010-05-04 NOTE — Assessment & Plan Note (Signed)
Summary: HTN   Vital Signs:  Patient profile:   60 year old female Weight:      195.1 pounds Pulse rate:   100 / minute Pulse rhythm:   regular Resp:     20 per minute BP sitting:   154 / 80  (left arm) Cuff size:   large  Vitals Entered By: Levon Hedger (April 29, 2010 10:53 AM) CC: follow-up visit Htn..joint pain, Hypertension Management, Lipid Management, Back Pain Is Patient Diabetic? No Pain Assessment Patient in pain? yes     Location: leg Intensity: 8  Does patient need assistance? Functional Status Self care Ambulation Normal   CC:  follow-up visit Htn..joint pain, Hypertension Management, Lipid Management, and Back Pain.  History of Present Illness:  Pt into the office for f/u on htn and back pain  Pt presents today with a list of medications other than her actual meds.  She continues to take lots of supplements and provider has reviewed all these on prior visits when she did bring her meds into the office.    Back Pain History:      The patient's back pain has been present for > 6 weeks.  The pain is located in the lower back region and does radiate below the knees.  She states that she has had a prior history of back pain.  The patient has not had any recent physical therapy for her back pain.        Description of injury in patient's own words:  Pt has had ongoing back pain with more radiation of pain into the left leg  over the past several years.  She has been seen [facility] over 1 year ago however pt decided at that time that she did not want surgerical intervention.    Critical Exclusionary Diagnosis Criteria (CEDC) for Back Pain:      There are no symptoms to suggest infection, cancer, cauda equina, or psychosocial factors for back pain.    Hypertension History:      She denies headache, chest pain, and palpitations.  She notes no problems with any antihypertensive medication side effects.        Positive major cardiovascular risk factors include  female age 105 years old or older, hyperlipidemia, hypertension, and current tobacco user.  Negative major cardiovascular risk factors include no history of diabetes and negative family history for ischemic heart disease.        Further assessment for target organ damage reveals no history of ASHD, cardiac end-organ damage (CHF/LVH), stroke/TIA, peripheral vascular disease, renal insufficiency, or hypertensive retinopathy.    Lipid Management History:      Positive NCEP/ATP III risk factors include female age 39 years old or older, current tobacco user, and hypertension.  Negative NCEP/ATP III risk factors include no history of early menopause without estrogen hormone replacement, non-diabetic, no family history for ischemic heart disease, no ASHD (atherosclerotic heart disease), no prior stroke/TIA, no peripheral vascular disease, and no history of aortic aneurysm.        The patient states that she knows about the "Therapeutic Lifestyle Change" diet.  Her compliance with the TLC diet is fair.  She expresses no side effects from her lipid-lowering medication.  The patient denies any symptoms to suggest myopathy or liver disease.       Habits & Providers  Alcohol-Tobacco-Diet     Alcohol drinks/day: 0     Tobacco Status: current     Tobacco Counseling: to quit use of tobacco products  Cigarette Packs/Day: 0.75     Year Started: age 20  Exercise-Depression-Behavior     Does Patient Exercise: yes     Exercise Counseling: to improve exercise regimen     Type of exercise: walking     Exercise (avg: min/session): <30     Depression Counseling: not indicated; screening negative for depression     Drug Use: no     Seat Belt Use: 100     Sun Exposure: occasionally  Comments: Pt used to ride her bike prior to moving to [place] and she did not have as many problems with back and leg.  Allergies (verified): 1)  ! Sulfa 2)  ! Penicillin  Review of Systems General:  Denies fever. CV:   Denies chest pain or discomfort. Resp:  Denies cough. GI:  Denies abdominal pain, nausea, and vomiting. MS:  Complains of low back pain. Neuro:  Complains of numbness; left leg.  Physical Exam  General:  alert.  obese long hair Head:  normocephalic.   Eyes:  glasses Abdomen:  normal bowel sounds.   Neurologic:  alert & oriented X3.   Skin:  color normal.   Psych:  Oriented X3.     Impression & Recommendations:  Problem # 1:  HYPERTENSION, BENIGN ESSENTIAL (ICD-401.1) BP is stil elevated DASH diet will increase medications Her updated medication list for this problem includes:    Amlodipine Besy-benazepril Hcl 10-40 Mg Caps (Amlodipine besy-benazepril hcl) ..... One tablet by mouth daily for blood pressure **note change in dose**  Problem # 2:  DISC DISEASE, LUMBAR (ICD-722.52) ongoing for pt  pt is not intestered in going to pain clinic for alternative methods at pain control  Problem # 3:  OBESITY (ICD-278.00) still no effort at weight loss by way of meaningful exercise  Problem # 4:  HYPERLIPIDEMIA (ICD-272.4)  Her updated medication list for this problem includes:    Zetia 10 Mg Tabs (Ezetimibe) .Marland Kitchen... 1 tablet by mouth daily for cholesterol    Pravastatin Sodium 80 Mg Tabs (Pravastatin sodium) ..... One tablet by mouth nightly for cholesterol  **pharmacy - d/c simvastatin**  Problem # 5:  LEG PAIN, LEFT (ICD-729.5)  advised pt to be more active.  she needs to improve her exercise routine  Problem # 6:  TOBACCO ABUSE (ICD-305.1) advised cessation   Complete Medication List: 1)  Amlodipine Besy-benazepril Hcl 10-40 Mg Caps (Amlodipine besy-benazepril hcl) .... One tablet by mouth daily for blood pressure **note change in dose** 2)  Naprosyn 500 Mg Tabs (Naproxen) .Marland Kitchen.. 1 tablet by mouth two times a day as needed 3)  Hydrocodone-acetaminophen 10-325 Mg Tabs (Hydrocodone-acetaminophen) .Marland Kitchen.. 1 tablet by mouth three times a day as needed for pain 4)  Calcium 600-d  600-400 Mg-unit Tabs (Calcium carbonate-vitamin d) .... One tablet by mouth two times a day for bones 5)  Zetia 10 Mg Tabs (Ezetimibe) .Marland Kitchen.. 1 tablet by mouth daily for cholesterol 6)  Co Q-10 120 Mg Caps (Coenzyme q10) .Marland Kitchen.. 1 tablet by mouth two times a day 7)  Colon Cleanser Caps (Misc natural products) .... 2 tablets by mouth two times a day 8)  Ginkgo Biloba 110-500-80 Mg Caps (Misc natural products) .... One tablet by mouth two times a day 9)  Boniva 150 Mg Tabs (Ibandronate sodium) .... One tablet by mouth monthly for bones 10)  Pravastatin Sodium 80 Mg Tabs (Pravastatin sodium) .... One tablet by mouth nightly for cholesterol  **pharmacy - d/c simvastatin** 11)  Flax Seed Oil 1000 Mg Caps (  Flaxseed (linseed)) .... One tablet by mouth daily  Other Orders: Mammogram (Screening) (Mammo)  Hypertension Assessment/Plan:      The patient's hypertensive risk group is category B: At least one risk factor (excluding diabetes) with no target organ damage.  Her calculated 10 year risk of coronary heart disease is 13 %.  Today's blood pressure is 154/80.  Her blood pressure goal is < 140/90.  Lipid Assessment/Plan:      Based on NCEP/ATP III, the patient's risk factor category is "2 or more risk factors and a calculated 10 year CAD risk of < 20%".  The patient's lipid goals are as follows: Total cholesterol goal is 200; LDL cholesterol goal is 130; HDL cholesterol goal is 40; Triglyceride goal is 150.    Patient Instructions: 1)  Follow up in 1 month for a triage nurse visit for blood pressure check.  Goal < 140/90. Take your medications before this visit.  2)  Follow up in 4 months (June 2012)for high blood pressure and back pain. 3)  Come fasting for labs.  You will need lipids, cmp, cbc. 4)  Blood pressure - Still elevated today as it has been on last few visit.  Your blood pressure medications will be increased.  5)  Keep your appointment for mammogram.  this office will fax over the  referral. 6)  Back/leg pain - this would likely get some better if you increased your exercise.  Try to get a stationary bike.  This would help with weight and to decrease inflammation in your joints. Prescriptions: AMLODIPINE BESY-BENAZEPRIL HCL 10-40 MG CAPS (AMLODIPINE BESY-BENAZEPRIL HCL) One tablet by mouth daily for blood pressure **note change in dose**  #90 x 1   Entered and Authorized by:   Lehman Prom FNP   Signed by:   Lehman Prom FNP on 04/29/2010   Method used:   Print then Give to Patient   RxID:   [id number]    Orders Added: 1)  Est. Patient Level IV [14782] 2)  Mammogram (Screening) [Mammo]    Prevention & Chronic Care Immunizations   Influenza vaccine: refused  (12/24/2009)   Influenza vaccine deferral: Refused  (03/09/2009)    Tetanus booster: 12/18/2007: Tdap    Pneumococcal vaccine: Not documented    H. zoster vaccine: Not documented  Colorectal Screening   Hemoccult: Not documented   Hemoccult action/deferral: NEG X 1 today  (04/11/2008)    Colonoscopy: Refused  (11/24/2008)   Colonoscopy action/deferral: Refused  (07/30/2009)  Other Screening   Pap smear:  Specimen Adequacy: Satisfactory for evaluation.   Interpretation/Result:Negative for intraepithelial Lesion or Malignancy.     (04/11/2008)   Pap smear action/deferral: PAP smear done  (04/11/2008)    Mammogram: ASSESSMENT: Negative - BI-RADS 1^MM DIGITAL SCREENING  (04/21/2009)   Mammogram action/deferral: mammogram ordered  (04/11/2008)    DXA bone density scan:  Lumbar Spine:  T Score< -2.5 Spine.     (04/18/2008)   Smoking status: current  (04/29/2010)   Smoking cessation counseling: yes  (04/11/2008)  Lipids   Total Cholesterol: 122  (09/02/2009)   LDL: 37  (09/02/2009)   LDL Direct: Not documented   HDL: 52  (09/02/2009)   Triglycerides: 167  (09/02/2009)    SGOT (AST): 17  (09/02/2009)   SGPT (ALT): 15  (09/02/2009)   Alkaline phosphatase: 80  (09/02/2009)    Total bilirubin: 0.4  (09/02/2009)  Hypertension   Last Blood Pressure: 154 / 80  (04/29/2010)   Serum creatinine: 0.70  (09/02/2009)  Serum potassium 4.0  (09/02/2009)  Self-Management Support :    Hypertension self-management support: Not documented    Lipid self-management support: Not documented

## 2010-05-19 ENCOUNTER — Ambulatory Visit
Admission: RE | Admit: 2010-05-19 | Discharge: 2010-05-19 | Disposition: A | Discharge status: Home / Self Care | Payer: Medicare Other | Source: Ambulatory Visit | Attending: Internal Medicine | Admitting: Internal Medicine

## 2010-05-19 DIAGNOSIS — Z1239 Encounter for other screening for malignant neoplasm of breast: Secondary | ICD-10-CM

## 2010-05-28 ENCOUNTER — Encounter: Payer: Self-pay | Admitting: Nurse Practitioner

## 2010-05-28 ENCOUNTER — Encounter (INDEPENDENT_AMBULATORY_CARE_PROVIDER_SITE_OTHER): Payer: Self-pay | Admitting: Nurse Practitioner

## 2010-05-28 LAB — CONVERTED CEMR LAB
ALT: 15 units/L (ref 0–35)
AST: 18 units/L (ref 0–37)
Basophils Relative: 0 % (ref 0–1)
CO2: 27 meq/L (ref 19–32)
Cholesterol: 131 mg/dL (ref 0–200)
Glucose, Bld: 117 mg/dL — ABNORMAL HIGH (ref 70–99)
HCT: 45 % (ref 36.0–46.0)
Hemoglobin: 14.7 g/dL (ref 12.0–15.0)
LDL Cholesterol: 45 mg/dL (ref 0–99)
Lymphs Abs: 3.2 10*3/uL (ref 0.7–4.0)
MCHC: 32.7 g/dL (ref 30.0–36.0)
MCV: 93.9 fL (ref 78.0–100.0)
Monocytes Relative: 5 % (ref 3–12)
Neutro Abs: 5.9 10*3/uL (ref 1.7–7.7)
Neutrophils Relative %: 59 % (ref 43–77)
Platelets: 335 10*3/uL (ref 150–400)
Potassium: 4.1 meq/L (ref 3.5–5.3)
RBC: 4.79 M/uL (ref 3.87–5.11)
RDW: 15.2 % (ref 11.5–15.5)
Triglycerides: 165 mg/dL — ABNORMAL HIGH (ref <150)
WBC: 9.9 10*3/uL (ref 4.0–10.5)

## 2010-05-31 ENCOUNTER — Encounter (INDEPENDENT_AMBULATORY_CARE_PROVIDER_SITE_OTHER): Payer: Self-pay | Admitting: Nurse Practitioner

## 2010-06-03 NOTE — Assessment & Plan Note (Signed)
Summary: BP recheck, LIPIDS, CBC, CMP  Nurse Visit   Vital Signs:  Patient profile:   60 year old female Pulse rate:   80 / minute Pulse rhythm:   regular Resp:     20 per minute BP sitting:   136 / 78  (left arm) Cuff size:   large  Vitals Entered By: Dutch Quint RN (May 28, 2010 11:41 AM)  Impression & Recommendations:  Problem # 1:  HYPERTENSION, BENIGN ESSENTIAL (ICD-401.1) BP much better, less than goal Labs done Continue meds, monitor salt Schedule f/u appt. with provider for June  Her updated medication list for this problem includes:    Amlodipine Besy-benazepril Hcl 10-40 Mg Caps (Amlodipine besy-benazepril hcl) ..... One tablet by mouth daily for blood pressure **note change in dose**  Orders: T-Comprehensive Metabolic Panel ([id number])  Complete Medication List: 1)  Amlodipine Besy-benazepril Hcl 10-40 Mg Caps (Amlodipine besy-benazepril hcl) .... One tablet by mouth daily for blood pressure **note change in dose** 2)  Naprosyn 500 Mg Tabs (Naproxen) .Marland Kitchen.. 1 tablet by mouth two times a day as needed 3)  Hydrocodone-acetaminophen 10-325 Mg Tabs (Hydrocodone-acetaminophen) .Marland Kitchen.. 1 tablet by mouth three times a day as needed for pain 4)  Calcium 600-d 600-400 Mg-unit Tabs (Calcium carbonate-vitamin d) .... One tablet by mouth two times a day for bones 5)  Zetia 10 Mg Tabs (Ezetimibe) .Marland Kitchen.. 1 tablet by mouth daily for cholesterol 6)  Co Q-10 120 Mg Caps (Coenzyme q10) .Marland Kitchen.. 1 tablet by mouth two times a day 7)  Colon Cleanser Caps (Misc natural products) .... 2 tablets by mouth two times a day 8)  Ginkgo Biloba 110-500-80 Mg Caps (Misc natural products) .... One tablet by mouth two times a day 9)  Boniva 150 Mg Tabs (Ibandronate sodium) .... One tablet by mouth monthly for bones 10)  Pravastatin Sodium 80 Mg Tabs (Pravastatin sodium) .... One tablet by mouth nightly for cholesterol  **pharmacy - d/c simvastatin** 11)  Flax Seed Oil 1000 Mg Caps (Flaxseed (linseed))  .... One tablet by mouth daily  Other Orders: T-Lipid Profile ([id number]) T-CBC w/Diff [id number])   Patient Instructions: 1)  Your blood pressure is much better. 2)  We will let you know the results of your labwork. 3)  Continue your medications as ordered, don't skip doses, make sure you refill your meds before you run out. 4)  Continue to monitor your salt intake, drink plenty of water. 5)  Schedule a follow-up appointment with provider for June. 6)  Call if you have any questions or if anything changes.   CC:  BP recheck and fasting labs.  History of Present Illness: 04/29/10 BP 154/80  P 100.  Last visit amlodipine-benazepril was increased, pt. was given DASH diet.  Started medication about 3-4 days ago.  Took meds this morning.  Goal is <140/90.     Review of Systems CV:  Denies CP, SOB, HA, dizziness, visual changes, peripheral edema.   Physical Exam  General:  alert, well-developed, well-nourished, well-hydrated, and overweight-appearing.    CC: BP recheck and fasting labs Is Patient Diabetic? No  Does patient need assistance? Functional Status Self care Ambulation Normal   Allergies: 1)  ! Sulfa 2)  ! Penicillin  Orders Added: 1)  T-Comprehensive Metabolic Panel [[id number]] 2)  T-Lipid Profile [[id number]] 3)  T-CBC w/Diff [[id number]] 4)  Est. Patient Level I [28413]

## 2010-06-08 NOTE — Letter (Signed)
Summary: *HSN Results Follow up  [facility]  [address] [place], [place] [address]   Phone: [phone]  Fax: [phone]      05/31/2010   [facility] [address] [place], [place]  [address]   Dear  Ms. Tammy Kirby,                            ____S.Drinkard,FNP   ____D. Gore,FNP       ____B. McPherson,MD   ____V. Rankins,MD    ____E. Mulberry,MD    _X___N. Daphine Deutscher, FNP  ____D. Reche Dixon, MD    ____K. Philipp Deputy, MD    ____Other     This letter is to inform you that your recent test(s):  _______Pap Smear    ___X____Lab Test     _______X-ray    ___X____ is within acceptable limits  _______ requires a medication change  _______ requires a follow-up lab visit  _______ requires a follow-up visit with your provider   Comments: Labs done during recent office visit are normal.       _________________________________________________________ If you have any questions, please contact our office [phone].                    Sincerely,    Tammy Prom FNP [facility]

## 2011-05-09 ENCOUNTER — Other Ambulatory Visit: Payer: Self-pay | Admitting: Internal Medicine

## 2011-05-09 DIAGNOSIS — Z1231 Encounter for screening mammogram for malignant neoplasm of breast: Secondary | ICD-10-CM

## 2011-06-01 ENCOUNTER — Ambulatory Visit: Payer: Medicare Other

## 2011-06-06 ENCOUNTER — Ambulatory Visit
Admission: RE | Admit: 2011-06-06 | Discharge: 2011-06-06 | Disposition: A | Discharge status: Home / Self Care | Payer: Medicare Other | Source: Ambulatory Visit | Attending: Internal Medicine | Admitting: Internal Medicine

## 2011-06-06 ENCOUNTER — Ambulatory Visit: Payer: Medicare Other

## 2011-06-06 DIAGNOSIS — Z1231 Encounter for screening mammogram for malignant neoplasm of breast: Secondary | ICD-10-CM

## 2011-07-04 ENCOUNTER — Ambulatory Visit: Payer: Medicare Other | Attending: Internal Medicine | Admitting: Rehabilitative and Restorative Service Providers"

## 2011-07-04 DIAGNOSIS — M545 Low back pain, unspecified: Secondary | ICD-10-CM | POA: Insufficient documentation

## 2011-07-04 DIAGNOSIS — M25559 Pain in unspecified hip: Secondary | ICD-10-CM | POA: Insufficient documentation

## 2011-07-04 DIAGNOSIS — IMO0001 Reserved for inherently not codable concepts without codable children: Secondary | ICD-10-CM | POA: Insufficient documentation

## 2011-07-07 ENCOUNTER — Encounter: Payer: Medicare Other | Admitting: Physical Therapy

## 2011-07-11 ENCOUNTER — Ambulatory Visit: Payer: Medicare Other | Attending: Internal Medicine | Admitting: Physical Therapy

## 2011-07-11 DIAGNOSIS — M25559 Pain in unspecified hip: Secondary | ICD-10-CM | POA: Insufficient documentation

## 2011-07-11 DIAGNOSIS — M545 Low back pain, unspecified: Secondary | ICD-10-CM | POA: Insufficient documentation

## 2011-07-11 DIAGNOSIS — IMO0001 Reserved for inherently not codable concepts without codable children: Secondary | ICD-10-CM | POA: Insufficient documentation

## 2011-07-12 ENCOUNTER — Other Ambulatory Visit (HOSPITAL_COMMUNITY)
Admission: RE | Admit: 2011-07-12 | Discharge: 2011-07-12 | Disposition: A | Discharge status: Home / Self Care | Payer: Medicare Other | Source: Ambulatory Visit | Attending: Internal Medicine | Admitting: Internal Medicine

## 2011-07-12 ENCOUNTER — Other Ambulatory Visit: Payer: Self-pay | Admitting: Internal Medicine

## 2011-07-12 DIAGNOSIS — Z124 Encounter for screening for malignant neoplasm of cervix: Secondary | ICD-10-CM | POA: Insufficient documentation

## 2011-07-14 ENCOUNTER — Ambulatory Visit: Payer: Medicare Other | Admitting: Physical Therapy

## 2011-07-19 ENCOUNTER — Ambulatory Visit: Payer: Medicare Other | Admitting: Physical Therapy

## 2011-07-22 ENCOUNTER — Other Ambulatory Visit (HOSPITAL_COMMUNITY): Payer: Self-pay | Admitting: Internal Medicine

## 2011-07-25 ENCOUNTER — Ambulatory Visit: Payer: Medicare Other | Admitting: Rehabilitative and Restorative Service Providers"

## 2011-07-27 ENCOUNTER — Inpatient Hospital Stay (HOSPITAL_COMMUNITY): Admission: RE | Admit: 2011-07-27 | Payer: Medicare Other | Source: Ambulatory Visit

## 2011-07-28 ENCOUNTER — Ambulatory Visit: Payer: Medicare Other | Admitting: Physical Therapy

## 2011-08-02 ENCOUNTER — Ambulatory Visit: Payer: Medicare Other | Admitting: Physical Therapy

## 2011-08-04 ENCOUNTER — Ambulatory Visit: Payer: Medicare Other | Admitting: Physical Therapy

## 2011-08-09 ENCOUNTER — Encounter: Payer: Medicare Other | Admitting: Physical Therapy

## 2011-08-11 ENCOUNTER — Ambulatory Visit: Payer: Medicare Other | Attending: Internal Medicine | Admitting: Physical Therapy

## 2011-08-11 DIAGNOSIS — IMO0001 Reserved for inherently not codable concepts without codable children: Secondary | ICD-10-CM | POA: Insufficient documentation

## 2011-08-11 DIAGNOSIS — M25559 Pain in unspecified hip: Secondary | ICD-10-CM | POA: Insufficient documentation

## 2011-08-11 DIAGNOSIS — M545 Low back pain, unspecified: Secondary | ICD-10-CM | POA: Insufficient documentation

## 2011-08-12 ENCOUNTER — Ambulatory Visit (HOSPITAL_COMMUNITY)
Admission: RE | Admit: 2011-08-12 | Discharge: 2011-08-12 | Disposition: A | Discharge status: Home / Self Care | Payer: Medicare Other | Source: Ambulatory Visit | Attending: Internal Medicine | Admitting: Internal Medicine

## 2011-08-12 DIAGNOSIS — R935 Abnormal findings on diagnostic imaging of other abdominal regions, including retroperitoneum: Secondary | ICD-10-CM | POA: Insufficient documentation

## 2011-08-12 DIAGNOSIS — E279 Disorder of adrenal gland, unspecified: Secondary | ICD-10-CM | POA: Insufficient documentation

## 2011-08-12 DIAGNOSIS — R229 Localized swelling, mass and lump, unspecified: Secondary | ICD-10-CM | POA: Insufficient documentation

## 2011-08-17 ENCOUNTER — Encounter: Payer: Medicare Other | Admitting: Rehabilitative and Restorative Service Providers"

## 2011-08-25 ENCOUNTER — Ambulatory Visit: Payer: Medicare Other | Admitting: Physical Therapy

## 2011-08-30 ENCOUNTER — Encounter: Payer: Medicare Other | Admitting: Physical Therapy

## 2011-09-01 ENCOUNTER — Encounter: Payer: Medicare Other | Admitting: Physical Therapy

## 2011-09-23 ENCOUNTER — Emergency Department (HOSPITAL_COMMUNITY)
Admission: EM | Admit: 2011-09-23 | Discharge: 2011-09-24 | Disposition: A | Discharge status: Home / Self Care | Payer: Medicare Other | Attending: Emergency Medicine | Admitting: Emergency Medicine

## 2011-09-23 ENCOUNTER — Encounter (HOSPITAL_COMMUNITY): Payer: Self-pay | Admitting: Emergency Medicine

## 2011-09-23 DIAGNOSIS — L989 Disorder of the skin and subcutaneous tissue, unspecified: Secondary | ICD-10-CM

## 2011-09-23 DIAGNOSIS — Z23 Encounter for immunization: Secondary | ICD-10-CM | POA: Insufficient documentation

## 2011-09-23 DIAGNOSIS — E7800 Pure hypercholesterolemia, unspecified: Secondary | ICD-10-CM | POA: Insufficient documentation

## 2011-09-23 DIAGNOSIS — H10219 Acute toxic conjunctivitis, unspecified eye: Secondary | ICD-10-CM | POA: Insufficient documentation

## 2011-09-23 DIAGNOSIS — Z882 Allergy status to sulfonamides status: Secondary | ICD-10-CM | POA: Insufficient documentation

## 2011-09-23 DIAGNOSIS — F17200 Nicotine dependence, unspecified, uncomplicated: Secondary | ICD-10-CM | POA: Insufficient documentation

## 2011-09-23 DIAGNOSIS — Z880 Allergy status to penicillin: Secondary | ICD-10-CM | POA: Insufficient documentation

## 2011-09-23 HISTORY — DX: Pure hypercholesterolemia, unspecified: E78.00

## 2011-09-23 HISTORY — DX: Essential (primary) hypertension: I10

## 2011-09-23 MED ORDER — TETRACAINE HCL 0.5 % OP SOLN
2.0000 [drp] | Freq: Once | OPHTHALMIC | Status: AC
  Administered 2011-09-23: 2 [drp] via OPHTHALMIC
  Filled 2011-09-23: qty 2

## 2011-09-23 MED ORDER — CLINDAMYCIN HCL 150 MG PO CAPS
150.0000 mg | ORAL_CAPSULE | Freq: Once | ORAL | Status: AC
  Administered 2011-09-24: 150 mg via ORAL
  Filled 2011-09-23: qty 1

## 2011-09-23 MED ORDER — TETANUS-DIPHTHERIA TOXOIDS TD 5-2 LFU IM INJ
0.5000 mL | INJECTION | Freq: Once | INTRAMUSCULAR | Status: DC
Start: 2011-09-23 — End: 2011-09-24
  Filled 2011-09-23: qty 0.5

## 2011-09-23 NOTE — ED Notes (Signed)
Pt sts that she was using peroxide and she touched her eyes. Pt also stated that she put some peroxide in her eyes, because she thinks she has an infection. Pt did this yesterday and today.  Pt now having pain to bilat eyes. Pt noted with redness. No trouble with vision.

## 2011-09-23 NOTE — ED Notes (Signed)
Pt's eyes being irrigated with warm lactated ringers 1 Liter in each eye.

## 2011-09-23 NOTE — ED Provider Notes (Signed)
History     CSN: [id number]  Arrival date & time 09/23/11  2058   First MD Initiated Contact with Patient 09/23/11 2242      Chief Complaint  Patient presents with  . Eye Pain    (Consider location/radiation/quality/duration/timing/severity/associated sxs/prior treatment) HPI Comments: Patient has been rinsing her eyes with peroxide there are now red, swollen and painful.  She also has multiple superficial skin lesions, which she's been again, washing with peroxide.  These appear to be insect bites.  There upper and her entire body.  She, states she's been dealing with "a house fly issue."  Patient is a 61 y.o. female presenting with eye pain. The history is provided by the patient.  Eye Pain This is a new problem. The current episode started yesterday. The problem has been unchanged. Associated symptoms include a rash. Pertinent negatives include no chills, fever or joint swelling. Nothing aggravates the symptoms. She has tried nothing for the symptoms.    Past Medical History  Diagnosis Date  . Hypertension   . Hypercholesteremia     History reviewed. No pertinent past surgical history.  History reviewed. No pertinent family history.  History  Substance Use Topics  . Smoking status: Current Some Day Smoker  . Smokeless tobacco: Not on file  . Alcohol Use: No    OB History    Grav Para Term Preterm Abortions TAB SAB Ect Mult Living                  Review of Systems  Constitutional: Negative for fever and chills.  Eyes: Positive for pain and redness. Negative for visual disturbance.  Musculoskeletal: Negative for joint swelling.  Skin: Positive for rash.    Allergies  Penicillins and Sulfonamide derivatives  Home Medications   Current Outpatient Rx  Name Route Sig Dispense Refill  . AMLODIPINE BESYLATE 10 MG PO TABS Oral Take 10 mg by mouth daily.    Marland Kitchen CINNAMON PO Oral Take 1 capsule by mouth daily.    Marland Kitchen FLAXSEED OIL PO Oral Take 1 tablet by mouth daily.     Marland Kitchen HYDROCODONE-ACETAMINOPHEN 10-325 MG PO TABS Oral Take 1 tablet by mouth every 8 (eight) hours as needed. For pain    . ADULT MULTIVITAMIN W/MINERALS CH Oral Take 1 tablet by mouth daily.    Marland Kitchen NAPROXEN 500 MG PO TABS Oral Take 500 mg by mouth 2 (two) times daily with a meal.    . OVER THE COUNTER MEDICATION Oral Take 1 tablet by mouth daily. Antioxidant supplement    . OVER THE COUNTER MEDICATION Oral Take 1 tablet by mouth daily. Trace mineral supplement    . CLINDAMYCIN HCL 150 MG PO CAPS Oral Take 1 capsule (150 mg total) by mouth 3 (three) times daily. 30 capsule 0    BP 168/79  Pulse 100  Temp 98.7 F (37.1 C) (Oral)  Resp 18  SpO2 100%  Physical Exam  Constitutional: She appears well-developed and well-nourished.  HENT:  Head: Normocephalic.  Eyes: EOM are normal. Pupils are equal, round, and reactive to light. Right conjunctiva is injected. Left conjunctiva is injected.       Both sclera injected and edematous.  No pain with eye movement visual acuity, unchanged.  Per patient does wear glasses  Neck: Normal range of motion.  Cardiovascular: Normal rate.   Pulmonary/Chest: She is in respiratory distress.  Musculoskeletal: Normal range of motion.  Neurological: She is alert.  Skin: Skin is warm and dry.  Multiple punctate lesions over extremities, trunk, abdomen, some closed slightly reddened and raised, others.  Open and scabbed over.  This is not typical of chickenpox appears more to be insect bites.    ED Course  Procedures (including critical care time)  Labs Reviewed - No data to display No results found.   1. Chemical conjunctivitis   2. Skin lesions, generalized       MDM  Patient's eyes were flushed, with 1 L of normal saline each.  I have spoken with ophthalmology, who states that if her vision is intact, which it is it is safe for her to followup in the office on Monday.  He agrees with the plan for erythromycin ointment 4 times a day.  She is to  call the office first thing Monday morning to set a time for further evaluation I have updated her tetanus and I will treat her skin lesions, with clindamycin 150 mg twice a day  for 10 days         Arman Filter, NP 09/24/11 0112  Arman Filter, NP 09/24/11 1610

## 2011-09-24 MED ORDER — ERYTHROMYCIN 5 MG/GM OP OINT
TOPICAL_OINTMENT | Freq: Once | OPHTHALMIC | Status: AC
  Administered 2011-09-24: 01:00:00 via OPHTHALMIC
  Filled 2011-09-24: qty 1

## 2011-09-24 MED ORDER — CLINDAMYCIN HCL 150 MG PO CAPS: 150.0000 mg | ORAL_CAPSULE | Freq: Three times a day (TID) | ORAL | Status: AC

## 2011-09-24 MED ORDER — TETANUS-DIPHTH-ACELL PERTUSSIS 5-2.5-18.5 LF-MCG/0.5 IM SUSP
0.5000 mL | Freq: Once | INTRAMUSCULAR | Status: AC
  Administered 2011-09-24: 0.5 mL via INTRAMUSCULAR

## 2011-09-24 NOTE — ED Notes (Signed)
Pt ambulated with a  Steady gait; VSS; A&Ox3; no signs of distress; respirations even and unlabored; skin warm and dry; no questions.

## 2011-09-25 NOTE — ED Provider Notes (Signed)
Medical screening examination/treatment/procedure(s) were performed by non-physician practitioner and as supervising physician I was immediately available for consultation/collaboration.  Roshanda Balazs, MD 09/25/11 0726 

## 2012-04-17 ENCOUNTER — Other Ambulatory Visit: Payer: Self-pay | Admitting: Oncology

## 2012-05-07 ENCOUNTER — Other Ambulatory Visit: Payer: Self-pay

## 2012-05-07 DIAGNOSIS — Z1231 Encounter for screening mammogram for malignant neoplasm of breast: Secondary | ICD-10-CM

## 2012-06-06 ENCOUNTER — Ambulatory Visit
Admission: RE | Admit: 2012-06-06 | Discharge: 2012-06-06 | Disposition: A | Discharge status: Home / Self Care | Payer: Medicaid Other | Source: Ambulatory Visit

## 2012-06-06 DIAGNOSIS — Z1231 Encounter for screening mammogram for malignant neoplasm of breast: Secondary | ICD-10-CM

## 2012-08-01 ENCOUNTER — Ambulatory Visit: Payer: PRIVATE HEALTH INSURANCE | Attending: Physician Assistant | Admitting: Physical Therapy

## 2012-08-01 DIAGNOSIS — M545 Low back pain, unspecified: Secondary | ICD-10-CM | POA: Insufficient documentation

## 2012-08-01 DIAGNOSIS — M25559 Pain in unspecified hip: Secondary | ICD-10-CM | POA: Insufficient documentation

## 2012-08-01 DIAGNOSIS — IMO0001 Reserved for inherently not codable concepts without codable children: Secondary | ICD-10-CM | POA: Insufficient documentation

## 2012-08-01 DIAGNOSIS — M79609 Pain in unspecified limb: Secondary | ICD-10-CM | POA: Insufficient documentation

## 2012-08-10 ENCOUNTER — Encounter: Payer: PRIVATE HEALTH INSURANCE | Admitting: Physical Therapy

## 2012-08-14 ENCOUNTER — Ambulatory Visit: Payer: PRIVATE HEALTH INSURANCE | Attending: Physician Assistant | Admitting: Rehabilitation

## 2012-08-14 DIAGNOSIS — M545 Low back pain, unspecified: Secondary | ICD-10-CM | POA: Insufficient documentation

## 2012-08-14 DIAGNOSIS — M25559 Pain in unspecified hip: Secondary | ICD-10-CM | POA: Insufficient documentation

## 2012-08-14 DIAGNOSIS — IMO0001 Reserved for inherently not codable concepts without codable children: Secondary | ICD-10-CM | POA: Insufficient documentation

## 2012-08-14 DIAGNOSIS — M79609 Pain in unspecified limb: Secondary | ICD-10-CM | POA: Insufficient documentation

## 2012-08-17 ENCOUNTER — Encounter: Payer: PRIVATE HEALTH INSURANCE | Admitting: Physical Therapy

## 2012-08-17 ENCOUNTER — Ambulatory Visit: Payer: PRIVATE HEALTH INSURANCE | Admitting: Physical Therapy

## 2012-08-22 ENCOUNTER — Ambulatory Visit: Payer: PRIVATE HEALTH INSURANCE | Admitting: Rehabilitation

## 2012-08-24 ENCOUNTER — Ambulatory Visit: Payer: PRIVATE HEALTH INSURANCE | Admitting: Rehabilitation

## 2012-08-29 ENCOUNTER — Ambulatory Visit: Payer: PRIVATE HEALTH INSURANCE | Admitting: Rehabilitation

## 2012-08-31 ENCOUNTER — Ambulatory Visit: Payer: PRIVATE HEALTH INSURANCE | Admitting: Physical Therapy

## 2012-09-04 ENCOUNTER — Ambulatory Visit: Payer: PRIVATE HEALTH INSURANCE | Attending: Rehabilitation | Admitting: Rehabilitation

## 2012-09-04 DIAGNOSIS — IMO0001 Reserved for inherently not codable concepts without codable children: Secondary | ICD-10-CM | POA: Insufficient documentation

## 2012-09-04 DIAGNOSIS — M25559 Pain in unspecified hip: Secondary | ICD-10-CM | POA: Insufficient documentation

## 2012-09-04 DIAGNOSIS — M79609 Pain in unspecified limb: Secondary | ICD-10-CM | POA: Insufficient documentation

## 2012-09-04 DIAGNOSIS — M545 Low back pain, unspecified: Secondary | ICD-10-CM | POA: Insufficient documentation

## 2012-09-11 ENCOUNTER — Ambulatory Visit: Payer: PRIVATE HEALTH INSURANCE | Admitting: Physical Therapy

## 2012-09-12 ENCOUNTER — Ambulatory Visit: Payer: PRIVATE HEALTH INSURANCE | Admitting: Physical Therapy

## 2013-05-14 ENCOUNTER — Other Ambulatory Visit: Payer: Self-pay

## 2013-05-14 DIAGNOSIS — Z1231 Encounter for screening mammogram for malignant neoplasm of breast: Secondary | ICD-10-CM

## 2013-06-07 ENCOUNTER — Ambulatory Visit
Admission: RE | Admit: 2013-06-07 | Discharge: 2013-06-07 | Disposition: A | Discharge status: Home / Self Care | Payer: PRIVATE HEALTH INSURANCE | Source: Ambulatory Visit

## 2013-06-07 DIAGNOSIS — Z1231 Encounter for screening mammogram for malignant neoplasm of breast: Secondary | ICD-10-CM

## 2013-08-19 ENCOUNTER — Ambulatory Visit: Payer: Self-pay | Admitting: Podiatry

## 2014-03-26 DIAGNOSIS — M79606 Pain in leg, unspecified: Secondary | ICD-10-CM | POA: Diagnosis not present

## 2014-03-26 DIAGNOSIS — M545 Low back pain: Secondary | ICD-10-CM | POA: Diagnosis not present

## 2014-03-26 DIAGNOSIS — Z79899 Other long term (current) drug therapy: Secondary | ICD-10-CM | POA: Diagnosis not present

## 2014-03-26 DIAGNOSIS — M5137 Other intervertebral disc degeneration, lumbosacral region: Secondary | ICD-10-CM | POA: Diagnosis not present

## 2014-03-26 DIAGNOSIS — G894 Chronic pain syndrome: Secondary | ICD-10-CM | POA: Diagnosis not present

## 2014-04-09 DIAGNOSIS — M545 Low back pain: Secondary | ICD-10-CM | POA: Diagnosis not present

## 2014-04-18 DIAGNOSIS — M4306 Spondylolysis, lumbar region: Secondary | ICD-10-CM | POA: Diagnosis not present

## 2014-04-18 DIAGNOSIS — M810 Age-related osteoporosis without current pathological fracture: Secondary | ICD-10-CM | POA: Diagnosis not present

## 2014-04-18 DIAGNOSIS — E784 Other hyperlipidemia: Secondary | ICD-10-CM | POA: Diagnosis not present

## 2014-04-18 DIAGNOSIS — I10 Essential (primary) hypertension: Secondary | ICD-10-CM | POA: Diagnosis not present

## 2014-04-18 DIAGNOSIS — F17200 Nicotine dependence, unspecified, uncomplicated: Secondary | ICD-10-CM | POA: Diagnosis not present

## 2014-04-21 ENCOUNTER — Encounter: Payer: Self-pay | Admitting: Internal Medicine

## 2014-04-23 ENCOUNTER — Other Ambulatory Visit (HOSPITAL_COMMUNITY): Payer: Self-pay | Admitting: Internal Medicine

## 2014-04-23 DIAGNOSIS — M545 Low back pain: Secondary | ICD-10-CM | POA: Diagnosis not present

## 2014-04-23 DIAGNOSIS — M5137 Other intervertebral disc degeneration, lumbosacral region: Secondary | ICD-10-CM | POA: Diagnosis not present

## 2014-04-23 DIAGNOSIS — M79606 Pain in leg, unspecified: Secondary | ICD-10-CM | POA: Diagnosis not present

## 2014-04-23 DIAGNOSIS — G894 Chronic pain syndrome: Secondary | ICD-10-CM | POA: Diagnosis not present

## 2014-04-23 DIAGNOSIS — E2839 Other primary ovarian failure: Secondary | ICD-10-CM

## 2014-04-23 DIAGNOSIS — M47817 Spondylosis without myelopathy or radiculopathy, lumbosacral region: Secondary | ICD-10-CM | POA: Diagnosis not present

## 2014-04-23 DIAGNOSIS — Z79899 Other long term (current) drug therapy: Secondary | ICD-10-CM | POA: Diagnosis not present

## 2014-05-07 ENCOUNTER — Other Ambulatory Visit: Payer: Self-pay

## 2014-05-07 DIAGNOSIS — M545 Low back pain: Secondary | ICD-10-CM | POA: Diagnosis not present

## 2014-05-07 DIAGNOSIS — Z1231 Encounter for screening mammogram for malignant neoplasm of breast: Secondary | ICD-10-CM

## 2014-05-07 DIAGNOSIS — G894 Chronic pain syndrome: Secondary | ICD-10-CM | POA: Diagnosis not present

## 2014-05-07 DIAGNOSIS — M79606 Pain in leg, unspecified: Secondary | ICD-10-CM | POA: Diagnosis not present

## 2014-05-07 DIAGNOSIS — M47817 Spondylosis without myelopathy or radiculopathy, lumbosacral region: Secondary | ICD-10-CM | POA: Diagnosis not present

## 2014-05-07 DIAGNOSIS — M5137 Other intervertebral disc degeneration, lumbosacral region: Secondary | ICD-10-CM | POA: Diagnosis not present

## 2014-05-07 DIAGNOSIS — Z79899 Other long term (current) drug therapy: Secondary | ICD-10-CM | POA: Diagnosis not present

## 2014-05-22 DIAGNOSIS — G894 Chronic pain syndrome: Secondary | ICD-10-CM | POA: Diagnosis not present

## 2014-05-22 DIAGNOSIS — M545 Low back pain: Secondary | ICD-10-CM | POA: Diagnosis not present

## 2014-05-22 DIAGNOSIS — M5137 Other intervertebral disc degeneration, lumbosacral region: Secondary | ICD-10-CM | POA: Diagnosis not present

## 2014-05-22 DIAGNOSIS — Z79899 Other long term (current) drug therapy: Secondary | ICD-10-CM | POA: Diagnosis not present

## 2014-05-22 DIAGNOSIS — M47817 Spondylosis without myelopathy or radiculopathy, lumbosacral region: Secondary | ICD-10-CM | POA: Diagnosis not present

## 2014-06-02 ENCOUNTER — Encounter: Payer: Medicaid Other | Admitting: Internal Medicine

## 2014-06-10 ENCOUNTER — Ambulatory Visit: Payer: Medicaid Other

## 2014-06-13 DIAGNOSIS — M545 Low back pain: Secondary | ICD-10-CM | POA: Diagnosis not present

## 2014-06-13 DIAGNOSIS — M47817 Spondylosis without myelopathy or radiculopathy, lumbosacral region: Secondary | ICD-10-CM | POA: Diagnosis not present

## 2014-06-13 DIAGNOSIS — M5137 Other intervertebral disc degeneration, lumbosacral region: Secondary | ICD-10-CM | POA: Diagnosis not present

## 2014-06-13 DIAGNOSIS — F17200 Nicotine dependence, unspecified, uncomplicated: Secondary | ICD-10-CM | POA: Diagnosis not present

## 2014-06-13 DIAGNOSIS — R0689 Other abnormalities of breathing: Secondary | ICD-10-CM | POA: Diagnosis not present

## 2014-06-20 ENCOUNTER — Ambulatory Visit
Admission: RE | Admit: 2014-06-20 | Discharge: 2014-06-20 | Disposition: A | Discharge status: Home / Self Care | Payer: Medicaid Other | Source: Ambulatory Visit

## 2014-06-20 DIAGNOSIS — M79606 Pain in leg, unspecified: Secondary | ICD-10-CM | POA: Diagnosis not present

## 2014-06-20 DIAGNOSIS — Z1231 Encounter for screening mammogram for malignant neoplasm of breast: Secondary | ICD-10-CM | POA: Diagnosis not present

## 2014-06-20 DIAGNOSIS — G894 Chronic pain syndrome: Secondary | ICD-10-CM | POA: Diagnosis not present

## 2014-06-20 DIAGNOSIS — Z79899 Other long term (current) drug therapy: Secondary | ICD-10-CM | POA: Diagnosis not present

## 2014-06-20 DIAGNOSIS — M5137 Other intervertebral disc degeneration, lumbosacral region: Secondary | ICD-10-CM | POA: Diagnosis not present

## 2014-06-20 DIAGNOSIS — M47817 Spondylosis without myelopathy or radiculopathy, lumbosacral region: Secondary | ICD-10-CM | POA: Diagnosis not present

## 2014-06-20 DIAGNOSIS — M545 Low back pain: Secondary | ICD-10-CM | POA: Diagnosis not present

## 2014-07-11 DIAGNOSIS — M5137 Other intervertebral disc degeneration, lumbosacral region: Secondary | ICD-10-CM | POA: Diagnosis not present

## 2014-07-18 DIAGNOSIS — M79606 Pain in leg, unspecified: Secondary | ICD-10-CM | POA: Diagnosis not present

## 2014-07-18 DIAGNOSIS — G894 Chronic pain syndrome: Secondary | ICD-10-CM | POA: Diagnosis not present

## 2014-07-18 DIAGNOSIS — E669 Obesity, unspecified: Secondary | ICD-10-CM | POA: Diagnosis not present

## 2014-07-18 DIAGNOSIS — M545 Low back pain: Secondary | ICD-10-CM | POA: Diagnosis not present

## 2014-07-18 DIAGNOSIS — M47817 Spondylosis without myelopathy or radiculopathy, lumbosacral region: Secondary | ICD-10-CM | POA: Diagnosis not present

## 2014-07-18 DIAGNOSIS — M5137 Other intervertebral disc degeneration, lumbosacral region: Secondary | ICD-10-CM | POA: Diagnosis not present

## 2014-07-18 DIAGNOSIS — Z79899 Other long term (current) drug therapy: Secondary | ICD-10-CM | POA: Diagnosis not present

## 2014-08-08 DIAGNOSIS — M5137 Other intervertebral disc degeneration, lumbosacral region: Secondary | ICD-10-CM | POA: Diagnosis not present

## 2014-08-21 DIAGNOSIS — E669 Obesity, unspecified: Secondary | ICD-10-CM | POA: Diagnosis not present

## 2014-08-21 DIAGNOSIS — M79606 Pain in leg, unspecified: Secondary | ICD-10-CM | POA: Diagnosis not present

## 2014-08-21 DIAGNOSIS — M5137 Other intervertebral disc degeneration, lumbosacral region: Secondary | ICD-10-CM | POA: Diagnosis not present

## 2014-08-21 DIAGNOSIS — Z79899 Other long term (current) drug therapy: Secondary | ICD-10-CM | POA: Diagnosis not present

## 2014-08-21 DIAGNOSIS — M545 Low back pain: Secondary | ICD-10-CM | POA: Diagnosis not present

## 2014-08-21 DIAGNOSIS — M47817 Spondylosis without myelopathy or radiculopathy, lumbosacral region: Secondary | ICD-10-CM | POA: Diagnosis not present

## 2014-08-21 DIAGNOSIS — G894 Chronic pain syndrome: Secondary | ICD-10-CM | POA: Diagnosis not present

## 2014-08-31 ENCOUNTER — Encounter (HOSPITAL_COMMUNITY): Payer: Self-pay | Admitting: *Deleted

## 2014-08-31 ENCOUNTER — Emergency Department (HOSPITAL_COMMUNITY)
Admission: EM | Admit: 2014-08-31 | Discharge: 2014-09-01 | Disposition: A | Discharge status: Home / Self Care | Payer: Medicare Other | Source: Home / Self Care | Attending: Emergency Medicine | Admitting: Emergency Medicine

## 2014-08-31 DIAGNOSIS — R197 Diarrhea, unspecified: Secondary | ICD-10-CM

## 2014-08-31 DIAGNOSIS — R000 Tachycardia, unspecified: Secondary | ICD-10-CM

## 2014-08-31 DIAGNOSIS — Z791 Long term (current) use of non-steroidal anti-inflammatories (NSAID): Secondary | ICD-10-CM

## 2014-08-31 DIAGNOSIS — Z720 Tobacco use: Secondary | ICD-10-CM

## 2014-08-31 DIAGNOSIS — I252 Old myocardial infarction: Secondary | ICD-10-CM | POA: Diagnosis not present

## 2014-08-31 DIAGNOSIS — M549 Dorsalgia, unspecified: Secondary | ICD-10-CM | POA: Diagnosis present

## 2014-08-31 DIAGNOSIS — I712 Thoracic aortic aneurysm, without rupture: Secondary | ICD-10-CM | POA: Diagnosis not present

## 2014-08-31 DIAGNOSIS — I719 Aortic aneurysm of unspecified site, without rupture: Secondary | ICD-10-CM | POA: Insufficient documentation

## 2014-08-31 DIAGNOSIS — R1011 Right upper quadrant pain: Secondary | ICD-10-CM | POA: Diagnosis not present

## 2014-08-31 DIAGNOSIS — K921 Melena: Principal | ICD-10-CM | POA: Diagnosis present

## 2014-08-31 DIAGNOSIS — Z79899 Other long term (current) drug therapy: Secondary | ICD-10-CM

## 2014-08-31 DIAGNOSIS — Z880 Allergy status to penicillin: Secondary | ICD-10-CM

## 2014-08-31 DIAGNOSIS — Z8639 Personal history of other endocrine, nutritional and metabolic disease: Secondary | ICD-10-CM | POA: Insufficient documentation

## 2014-08-31 DIAGNOSIS — D3501 Benign neoplasm of right adrenal gland: Secondary | ICD-10-CM | POA: Diagnosis not present

## 2014-08-31 DIAGNOSIS — I10 Essential (primary) hypertension: Secondary | ICD-10-CM | POA: Diagnosis present

## 2014-08-31 DIAGNOSIS — G894 Chronic pain syndrome: Secondary | ICD-10-CM | POA: Diagnosis present

## 2014-08-31 DIAGNOSIS — R1110 Vomiting, unspecified: Secondary | ICD-10-CM

## 2014-08-31 DIAGNOSIS — E279 Disorder of adrenal gland, unspecified: Secondary | ICD-10-CM | POA: Diagnosis not present

## 2014-08-31 DIAGNOSIS — K269 Duodenal ulcer, unspecified as acute or chronic, without hemorrhage or perforation: Secondary | ICD-10-CM | POA: Diagnosis present

## 2014-08-31 DIAGNOSIS — F17210 Nicotine dependence, cigarettes, uncomplicated: Secondary | ICD-10-CM | POA: Diagnosis present

## 2014-08-31 DIAGNOSIS — E780 Pure hypercholesterolemia: Secondary | ICD-10-CM | POA: Diagnosis not present

## 2014-08-31 DIAGNOSIS — D62 Acute posthemorrhagic anemia: Secondary | ICD-10-CM | POA: Diagnosis not present

## 2014-08-31 DIAGNOSIS — R1084 Generalized abdominal pain: Secondary | ICD-10-CM | POA: Diagnosis not present

## 2014-08-31 LAB — CBC WITH DIFFERENTIAL/PLATELET
BASOS PCT: 0 % (ref 0–1)
Basophils Absolute: 0 10*3/uL (ref 0.0–0.1)
Eosinophils Absolute: 0 10*3/uL (ref 0.0–0.7)
Eosinophils Relative: 0 % (ref 0–5)
HEMATOCRIT: 34.9 % — AB (ref 36.0–46.0)
Hemoglobin: 11.6 g/dL — ABNORMAL LOW (ref 12.0–15.0)
Lymphocytes Relative: 20 % (ref 12–46)
Lymphs Abs: 2.5 10*3/uL (ref 0.7–4.0)
MCH: 29.9 pg (ref 26.0–34.0)
MCHC: 33.2 g/dL (ref 30.0–36.0)
MCV: 89.9 fL (ref 78.0–100.0)
Monocytes Absolute: 0.7 10*3/uL (ref 0.1–1.0)
Monocytes Relative: 5 % (ref 3–12)
Neutro Abs: 9.3 10*3/uL — ABNORMAL HIGH (ref 1.7–7.7)
Neutrophils Relative %: 75 % (ref 43–77)
PLATELETS: 371 10*3/uL (ref 150–400)
RBC: 3.88 MIL/uL (ref 3.87–5.11)
RDW: 14.8 % (ref 11.5–15.5)
WBC: 12.5 10*3/uL — ABNORMAL HIGH (ref 4.0–10.5)

## 2014-08-31 LAB — COMPREHENSIVE METABOLIC PANEL
ALK PHOS: 74 U/L (ref 38–126)
ALT: 15 U/L (ref 14–54)
ANION GAP: 10 (ref 5–15)
AST: 15 U/L (ref 15–41)
Albumin: 3 g/dL — ABNORMAL LOW (ref 3.5–5.0)
BILIRUBIN TOTAL: 0.5 mg/dL (ref 0.3–1.2)
BUN: 37 mg/dL — ABNORMAL HIGH (ref 6–20)
CO2: 23 mmol/L (ref 22–32)
CREATININE: 1.04 mg/dL — AB (ref 0.44–1.00)
Calcium: 8.7 mg/dL — ABNORMAL LOW (ref 8.9–10.3)
Chloride: 108 mmol/L (ref 101–111)
GFR calc Af Amer: >60 mL/min (ref >60)
GFR, EST NON AFRICAN AMERICAN: 55 mL/min — AB (ref >60)
GLUCOSE: 131 mg/dL — AB (ref 65–99)
Potassium: 3.6 mmol/L (ref 3.5–5.1)
Sodium: 141 mmol/L (ref 135–145)
TOTAL PROTEIN: 6.6 g/dL (ref 6.5–8.1)

## 2014-08-31 LAB — LIPASE, BLOOD: Lipase: 15 U/L — ABNORMAL LOW (ref 22–51)

## 2014-08-31 NOTE — ED Notes (Signed)
Pt states that she has been having diarrhea x 3 days. States that her stool looks blacks. Pt also c/o dizziness x 2 days. Pt denies hx of same. C/o abd pain.

## 2014-08-31 NOTE — ED Provider Notes (Signed)
CSN: [id number]     Arrival date & time 08/31/14  2156 History   First MD Initiated Contact with Patient 08/31/14 2236     Chief Complaint  Patient presents with  . Diarrhea  . Dizziness     (Consider location/radiation/quality/duration/timing/severity/associated sxs/prior Treatment) HPI Comments: Patient is a 64 year old female with a past medical history of hypertension and hyperlipidemia who presents with a 3 day history of abdominal pain. The pain is located in her generalized abdomen and does not radiate. The pain is described as cramping and severe. The pain started gradually and progressively worsened since the onset. No alleviating/aggravating factors. The patient has tried nothing for symptoms without relief. Associated symptoms include diarrhea. She reports 8 episodes of watery diarrhea per day. Patient denies fever, headache, nausea, vomiting, chest pain, SOB, dysuria, constipation. No recent travel, antibiotic use, or suspicious food intake.       Past Medical History  Diagnosis Date  . Hypertension   . Hypercholesteremia    Past Surgical History  Procedure Laterality Date  . Cholecystectomy     No family history on file. History  Substance Use Topics  . Smoking status: Current Some Day Smoker  . Smokeless tobacco: Not on file  . Alcohol Use: No   OB History    No data available     Review of Systems  Constitutional: Negative for fever, chills and fatigue.  HENT: Negative for trouble swallowing.   Eyes: Negative for visual disturbance.  Respiratory: Negative for shortness of breath.   Cardiovascular: Negative for chest pain and palpitations.  Gastrointestinal: Positive for vomiting and abdominal pain. Negative for nausea and diarrhea.  Genitourinary: Negative for dysuria and difficulty urinating.  Musculoskeletal: Negative for arthralgias and neck pain.  Skin: Negative for color change.  Neurological: Negative for dizziness and weakness.   Psychiatric/Behavioral: Negative for dysphoric mood.      Allergies  Penicillins and Sulfonamide derivatives  Home Medications   Prior to Admission medications   Medication Sig Start Date End Date Taking? Authorizing Provider  amLODipine (NORVASC) 10 MG tablet Take 10 mg by mouth daily.    Historical Provider, MD  CINNAMON PO Take 1 capsule by mouth daily.    Historical Provider, MD  Flaxseed, Linseed, (FLAXSEED OIL PO) Take 1 tablet by mouth daily.    Historical Provider, MD  HYDROcodone-acetaminophen (NORCO) 10-325 MG per tablet Take 1 tablet by mouth every 8 (eight) hours as needed. For pain    Historical Provider, MD  Multiple Vitamin (MULTIVITAMIN WITH MINERALS) TABS Take 1 tablet by mouth daily.    Historical Provider, MD  naproxen (NAPROSYN) 500 MG tablet Take 500 mg by mouth 2 (two) times daily with a meal.    Historical Provider, MD  OVER THE COUNTER MEDICATION Take 1 tablet by mouth daily. Antioxidant supplement    Historical Provider, MD  OVER THE COUNTER MEDICATION Take 1 tablet by mouth daily. Trace mineral supplement    Historical Provider, MD   BP 123/89 mmHg  Pulse 95  Temp(Src) 98.5 F (36.9 C)  Resp 16  SpO2 98% Physical Exam  Constitutional: She is oriented to person, place, and time. She appears well-developed and well-nourished. No distress.  HENT:  Head: Normocephalic and atraumatic.  Eyes: Conjunctivae and EOM are normal.  Neck: Normal range of motion.  Cardiovascular: Regular rhythm.  Exam reveals no gallop and no friction rub.   No murmur heard. tachycardic  Pulmonary/Chest: Effort normal and breath sounds normal. She has no wheezes.  She has no rales. She exhibits no tenderness.  Abdominal: Soft. She exhibits no distension. There is tenderness. There is no rebound and no guarding.  Generalized abdominal tenderness to palpation without focal tenderness or peritoneal signs.   Musculoskeletal: Normal range of motion.  Neurological: She is alert and  oriented to person, place, and time. Coordination normal.  Speech is goal-oriented. Moves limbs without ataxia.   Skin: Skin is warm and dry.  Psychiatric: She has a normal mood and affect. Her behavior is normal.  Nursing note and vitals reviewed.   ED Course  Procedures (including critical care time) Labs Review Labs Reviewed  CBC WITH DIFFERENTIAL/PLATELET - Abnormal; Notable for the following:    WBC 12.5 (*)    Hemoglobin 11.6 (*)    HCT 34.9 (*)    Neutro Abs 9.3 (*)    All other components within normal limits  COMPREHENSIVE METABOLIC PANEL - Abnormal; Notable for the following:    Glucose, Bld 131 (*)    BUN 37 (*)    Creatinine, Ser 1.04 (*)    Calcium 8.7 (*)    Albumin 3.0 (*)    GFR calc non Af Amer 55 (*)    All other components within normal limits  LIPASE, BLOOD - Abnormal; Notable for the following:    Lipase 15 (*)    All other components within normal limits  URINALYSIS, ROUTINE W REFLEX MICROSCOPIC (NOT AT [facility])    Imaging Review Ct Abdomen Pelvis W Contrast  09/01/2014   CLINICAL DATA:  Abdominal pain, diarrhea and nausea. 3-4 days duration.  EXAM: CT ABDOMEN AND PELVIS WITH CONTRAST  TECHNIQUE: Multidetector CT imaging of the abdomen and pelvis was performed using the standard protocol following bolus administration of intravenous contrast.  CONTRAST:  159mL OMNIPAQUE IOHEXOL 300 MG/ML  SOLN  COMPARISON:  08/12/2011, 11/15/2007.  FINDINGS: There is mild inflammatory stranding around the gastric pylorus and duodenal bulb. This could represent peptic ulcer disease. There is no extraluminal air. Remainder of the small bowel appears normal. The appendix is normal. The colon is remarkable only for mild uncomplicated diverticulosis.  There is prior cholecystectomy. The bile ducts appear normal. The liver appears normal. The spleen, pancreas and kidneys appear unremarkable. There is a 2.3 cm right adrenal mass which is unchanged from 08/12/2011 and which has been  characterized as a benign adenoma on MRI. Left adrenal is normal.  There is a 12 mm right lateral uterine mass which likely represents a benign fibroid. There are otherwise unremarkable appearances of the uterus and ovaries.  There is moderate aneurysm enlargement of the distal thoracic aorta with maximum AP diameter of 3.8 cm. The aorta tapers to 3.4 cm at the celiac origin and 2.8 cm at the renal artery origins. It remains normal in caliber throughout the remainder of its length with moderate atherosclerotic calcification. In the lower thoracic aorta and upper abdominal aorta there is prominent mural thrombus or chronic dissection and this appears to be unchanged from the MRI of 08/12/2011.  No other significant abnormality is evident in the lower chest.  No significant skeletal lesions are evident. There is severe degenerative disc disease and moderate facet arthritis in the lumbar spine, with grade 1 degenerative-appearing anterolisthesis at L4-5.  IMPRESSION: 1. Mild inflammation around the pylorus and duodenal bulb, suspicious for peptic ulcer disease. No extraluminal air. 2. 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 Prominent mural thrombus versus chronic dissection of the lower thoracic  aorta and upper abdominal aorta, likely unchanged from 08/12/2011 although the caliber of the aorta has probably enlarged since that time. 3. Unchanged benign right adrenal adenoma. 4. Probable 12 mm right lateral uterine fibroid. 5. Severe lumbar degenerative disc and facet disease.   Electronically Signed   By: Andreas Newport M.D.   On: 09/01/2014 02:23     EKG Interpretation None      MDM   Final diagnoses:  Diarrhea  Aortic aneurysm    11:36 PM Labs show elevated WBC at 12.5. Creatinine mildly elevated likely due to dehydration. Remaining labs unremarkable for acute changes. Vitals stable and patient afebrile. Patient will have fluids.    3:15 AM Patient's abdominal CT shows inflammation of pylorus and duodenal bulb. Patient likely has viral illness. CT also shows 3.8 cm descending thoracic aortic aneurysm. Patient will have additional fluids here and be discharged with Vascular surgery follow up.    [address] [place], PA-C 09/01/14 2505  Evelina Bucy, MD 09/02/14 [phone]

## 2014-08-31 NOTE — ED Notes (Signed)
Pt in restroom 

## 2014-09-01 ENCOUNTER — Emergency Department (HOSPITAL_COMMUNITY): Payer: Medicare Other

## 2014-09-01 ENCOUNTER — Encounter (HOSPITAL_COMMUNITY): Payer: Self-pay | Admitting: Radiology

## 2014-09-01 MED ORDER — SODIUM CHLORIDE 0.9 % IV BOLUS (SEPSIS)
1000.0000 mL | Freq: Once | INTRAVENOUS | Status: AC
  Administered 2014-09-01: 1000 mL via INTRAVENOUS

## 2014-09-01 MED ORDER — IOHEXOL 300 MG/ML  SOLN
25.0000 mL | Freq: Once | INTRAMUSCULAR | Status: AC | PRN
  Administered 2014-09-01: 25 mL via ORAL

## 2014-09-01 MED ORDER — ONDANSETRON HCL 4 MG/2ML IJ SOLN
4.0000 mg | Freq: Once | INTRAMUSCULAR | Status: AC
  Administered 2014-09-01: 4 mg via INTRAVENOUS
  Filled 2014-09-01: qty 2

## 2014-09-01 MED ORDER — DICYCLOMINE HCL 10 MG/ML IM SOLN
20.0000 mg | Freq: Once | INTRAMUSCULAR | Status: AC
  Administered 2014-09-01: 20 mg via INTRAMUSCULAR
  Filled 2014-09-01: qty 2

## 2014-09-01 MED ORDER — FOSFOMYCIN TROMETHAMINE 3 G PO PACK
3.0000 g | PACK | Freq: Once | ORAL | Status: AC
  Administered 2014-09-01: 3 g via ORAL
  Filled 2014-09-01: qty 3

## 2014-09-01 MED ORDER — IOHEXOL 300 MG/ML  SOLN
100.0000 mL | Freq: Once | INTRAMUSCULAR | Status: AC | PRN
  Administered 2014-09-01: 100 mL via INTRAVENOUS

## 2014-09-01 MED ORDER — ONDANSETRON 4 MG PO TBDP: 4.0000 mg | ORAL_TABLET | Freq: Three times a day (TID) | ORAL | Status: DC | PRN

## 2014-09-01 MED ORDER — MORPHINE SULFATE 4 MG/ML IJ SOLN
4.0000 mg | Freq: Once | INTRAMUSCULAR | Status: AC
  Administered 2014-09-01: 4 mg via INTRAVENOUS
  Filled 2014-09-01: qty 1

## 2014-09-01 MED ORDER — DICYCLOMINE HCL 20 MG PO TABS: 20.0000 mg | ORAL_TABLET | Freq: Two times a day (BID) | ORAL | Status: DC

## 2014-09-01 MED ORDER — LOPERAMIDE HCL 2 MG PO CAPS
2.0000 mg | ORAL_CAPSULE | Freq: Four times a day (QID) | ORAL | Status: DC | PRN
Start: 2014-09-01 — End: 2014-11-05

## 2014-09-01 NOTE — ED Notes (Signed)
Patient transported to CT 

## 2014-09-01 NOTE — ED Notes (Signed)
PA at bedside.

## 2014-09-01 NOTE — Discharge Instructions (Signed)
Take immodium as needed for diarrhea. Take bentyl for abdominal pain. Take zofran for nausea. Refer to attached documents for more information.

## 2014-09-03 ENCOUNTER — Encounter: Payer: Self-pay | Admitting: Vascular Surgery

## 2014-09-04 ENCOUNTER — Inpatient Hospital Stay (HOSPITAL_COMMUNITY)
Admission: EM | Admit: 2014-09-04 | Discharge: 2014-09-06 | DRG: 378 | Disposition: A | Discharge status: Home / Self Care | Payer: Medicare Other | Attending: Internal Medicine | Admitting: Internal Medicine

## 2014-09-04 ENCOUNTER — Encounter (HOSPITAL_COMMUNITY): Payer: Self-pay | Admitting: Emergency Medicine

## 2014-09-04 DIAGNOSIS — M5137 Other intervertebral disc degeneration, lumbosacral region: Secondary | ICD-10-CM | POA: Diagnosis present

## 2014-09-04 DIAGNOSIS — K624 Stenosis of anus and rectum: Secondary | ICD-10-CM | POA: Diagnosis not present

## 2014-09-04 DIAGNOSIS — M549 Dorsalgia, unspecified: Secondary | ICD-10-CM | POA: Diagnosis not present

## 2014-09-04 DIAGNOSIS — I10 Essential (primary) hypertension: Secondary | ICD-10-CM | POA: Diagnosis not present

## 2014-09-04 DIAGNOSIS — K279 Peptic ulcer, site unspecified, unspecified as acute or chronic, without hemorrhage or perforation: Secondary | ICD-10-CM

## 2014-09-04 DIAGNOSIS — K2950 Unspecified chronic gastritis without bleeding: Secondary | ICD-10-CM | POA: Diagnosis not present

## 2014-09-04 DIAGNOSIS — K922 Gastrointestinal hemorrhage, unspecified: Secondary | ICD-10-CM | POA: Diagnosis not present

## 2014-09-04 DIAGNOSIS — E780 Pure hypercholesterolemia: Secondary | ICD-10-CM | POA: Diagnosis not present

## 2014-09-04 DIAGNOSIS — G894 Chronic pain syndrome: Secondary | ICD-10-CM | POA: Diagnosis not present

## 2014-09-04 DIAGNOSIS — I712 Thoracic aortic aneurysm, without rupture, unspecified: Secondary | ICD-10-CM | POA: Diagnosis present

## 2014-09-04 DIAGNOSIS — K269 Duodenal ulcer, unspecified as acute or chronic, without hemorrhage or perforation: Secondary | ICD-10-CM | POA: Diagnosis not present

## 2014-09-04 DIAGNOSIS — D649 Anemia, unspecified: Secondary | ICD-10-CM

## 2014-09-04 DIAGNOSIS — E279 Disorder of adrenal gland, unspecified: Secondary | ICD-10-CM | POA: Diagnosis not present

## 2014-09-04 DIAGNOSIS — D62 Acute posthemorrhagic anemia: Secondary | ICD-10-CM | POA: Diagnosis present

## 2014-09-04 DIAGNOSIS — K449 Diaphragmatic hernia without obstruction or gangrene: Secondary | ICD-10-CM | POA: Diagnosis not present

## 2014-09-04 DIAGNOSIS — M51379 Other intervertebral disc degeneration, lumbosacral region without mention of lumbar back pain or lower extremity pain: Secondary | ICD-10-CM | POA: Diagnosis present

## 2014-09-04 DIAGNOSIS — Z791 Long term (current) use of non-steroidal anti-inflammatories (NSAID): Secondary | ICD-10-CM | POA: Diagnosis not present

## 2014-09-04 DIAGNOSIS — I252 Old myocardial infarction: Secondary | ICD-10-CM | POA: Diagnosis not present

## 2014-09-04 DIAGNOSIS — R1011 Right upper quadrant pain: Secondary | ICD-10-CM | POA: Diagnosis present

## 2014-09-04 DIAGNOSIS — K921 Melena: Secondary | ICD-10-CM | POA: Diagnosis not present

## 2014-09-04 DIAGNOSIS — F17210 Nicotine dependence, cigarettes, uncomplicated: Secondary | ICD-10-CM | POA: Diagnosis not present

## 2014-09-04 LAB — COMPREHENSIVE METABOLIC PANEL
ALK PHOS: 62 U/L (ref 38–126)
ALT: 14 U/L (ref 14–54)
AST: 17 U/L (ref 15–41)
Albumin: 2.5 g/dL — ABNORMAL LOW (ref 3.5–5.0)
Anion gap: 9 (ref 5–15)
BUN: 6 mg/dL (ref 6–20)
CALCIUM: 8.1 mg/dL — AB (ref 8.9–10.3)
CO2: 23 mmol/L (ref 22–32)
Chloride: 101 mmol/L (ref 101–111)
Creatinine, Ser: 0.79 mg/dL (ref 0.44–1.00)
GFR calc Af Amer: >60 mL/min (ref >60)
GFR calc non Af Amer: >60 mL/min (ref >60)
Glucose, Bld: 115 mg/dL — ABNORMAL HIGH (ref 65–99)
Potassium: 3.3 mmol/L — ABNORMAL LOW (ref 3.5–5.1)
Sodium: 133 mmol/L — ABNORMAL LOW (ref 135–145)
Total Bilirubin: 0.4 mg/dL (ref 0.3–1.2)
Total Protein: 5.4 g/dL — ABNORMAL LOW (ref 6.5–8.1)

## 2014-09-04 LAB — CBC WITH DIFFERENTIAL/PLATELET
BASOS ABS: 0 10*3/uL (ref 0.0–0.1)
Basophils Relative: 0 % (ref 0–1)
Eosinophils Absolute: 0 10*3/uL (ref 0.0–0.7)
Eosinophils Relative: 0 % (ref 0–5)
HEMATOCRIT: 19.3 % — AB (ref 36.0–46.0)
Hemoglobin: 6.4 g/dL — CL (ref 12.0–15.0)
Lymphocytes Relative: 14 % (ref 12–46)
Lymphs Abs: 1.7 10*3/uL (ref 0.7–4.0)
MCH: 29.9 pg (ref 26.0–34.0)
MCHC: 33.2 g/dL (ref 30.0–36.0)
MCV: 90.2 fL (ref 78.0–100.0)
MONO ABS: 0.7 10*3/uL (ref 0.1–1.0)
Monocytes Relative: 6 % (ref 3–12)
Neutro Abs: 9.9 10*3/uL — ABNORMAL HIGH (ref 1.7–7.7)
Neutrophils Relative %: 81 % — ABNORMAL HIGH (ref 43–77)
PLATELETS: 329 10*3/uL (ref 150–400)
RBC: 2.14 MIL/uL — AB (ref 3.87–5.11)
RDW: 15.2 % (ref 11.5–15.5)
WBC: 12.2 10*3/uL — AB (ref 4.0–10.5)

## 2014-09-04 LAB — CBC
HEMATOCRIT: 20.5 % — AB (ref 36.0–46.0)
Hemoglobin: 6.8 g/dL — CL (ref 12.0–15.0)
MCH: 30.1 pg (ref 26.0–34.0)
MCHC: 33.2 g/dL (ref 30.0–36.0)
MCV: 90.7 fL (ref 78.0–100.0)
Platelets: 328 10*3/uL (ref 150–400)
RBC: 2.26 MIL/uL — ABNORMAL LOW (ref 3.87–5.11)
RDW: 15.4 % (ref 11.5–15.5)
WBC: 14.1 10*3/uL — AB (ref 4.0–10.5)

## 2014-09-04 LAB — POC OCCULT BLOOD, ED: FECAL OCCULT BLD: POSITIVE — AB

## 2014-09-04 LAB — ABO/RH: ABO/RH(D): A POS

## 2014-09-04 LAB — PREPARE RBC (CROSSMATCH)

## 2014-09-04 MED ORDER — FENTANYL CITRATE (PF) 100 MCG/2ML IJ SOLN
50.0000 ug | INTRAMUSCULAR | Status: DC | PRN
  Administered 2014-09-04: 50 ug via INTRAVENOUS
  Filled 2014-09-04: qty 2

## 2014-09-04 MED ORDER — MORPHINE SULFATE 2 MG/ML IJ SOLN
2.0000 mg | INTRAMUSCULAR | Status: DC | PRN
  Administered 2014-09-04 – 2014-09-06 (×6): 2 mg via INTRAVENOUS
  Filled 2014-09-04 (×6): qty 1

## 2014-09-04 MED ORDER — DIPHENHYDRAMINE HCL 50 MG/ML IJ SOLN
25.0000 mg | Freq: Once | INTRAMUSCULAR | Status: AC
Start: 2014-09-04 — End: 2014-09-04
  Administered 2014-09-04: 25 mg via INTRAVENOUS
  Filled 2014-09-04: qty 1

## 2014-09-04 MED ORDER — GUAIFENESIN-DM 100-10 MG/5ML PO SYRP: 5.0000 mL | ORAL_SOLUTION | ORAL | Status: DC | PRN

## 2014-09-04 MED ORDER — SODIUM CHLORIDE 0.9 % IV SOLN
INTRAVENOUS | Status: DC
  Administered 2014-09-04: 19:00:00 via INTRAVENOUS

## 2014-09-04 MED ORDER — SODIUM CHLORIDE 0.9 % IV SOLN
INTRAVENOUS | Status: DC
  Administered 2014-09-04 – 2014-09-05 (×3): via INTRAVENOUS
  Filled 2014-09-04 (×5): qty 1000

## 2014-09-04 MED ORDER — SODIUM CHLORIDE 0.9 % IV SOLN
8.0000 mg/h | INTRAVENOUS | Status: DC
  Administered 2014-09-04 – 2014-09-06 (×4): 8 mg/h via INTRAVENOUS
  Filled 2014-09-04 (×9): qty 80

## 2014-09-04 MED ORDER — FUROSEMIDE 10 MG/ML IJ SOLN
20.0000 mg | Freq: Once | INTRAMUSCULAR | Status: AC
Start: 2014-09-04 — End: 2014-09-05
  Administered 2014-09-05: 20 mg via INTRAVENOUS
  Filled 2014-09-04: qty 2

## 2014-09-04 MED ORDER — ACETAMINOPHEN 325 MG PO TABS
650.0000 mg | ORAL_TABLET | Freq: Once | ORAL | Status: AC
  Administered 2014-09-04: 650 mg via ORAL
  Filled 2014-09-04: qty 2

## 2014-09-04 MED ORDER — ACETAMINOPHEN 650 MG RE SUPP: 650.0000 mg | Freq: Four times a day (QID) | RECTAL | Status: DC | PRN

## 2014-09-04 MED ORDER — CHLORHEXIDINE GLUCONATE 0.12 % MT SOLN
15.0000 mL | Freq: Two times a day (BID) | OROMUCOSAL | Status: DC
  Administered 2014-09-04 – 2014-09-05 (×3): 15 mL via OROMUCOSAL
  Filled 2014-09-04 (×6): qty 15

## 2014-09-04 MED ORDER — FUROSEMIDE 10 MG/ML IJ SOLN: 20.0000 mg | Freq: Once | INTRAMUSCULAR | Status: DC

## 2014-09-04 MED ORDER — ACETAMINOPHEN 325 MG PO TABS
650.0000 mg | ORAL_TABLET | Freq: Four times a day (QID) | ORAL | Status: DC | PRN
  Administered 2014-09-05: 650 mg via ORAL
  Filled 2014-09-04: qty 2

## 2014-09-04 MED ORDER — ONDANSETRON HCL 4 MG/2ML IJ SOLN: 4.0000 mg | Freq: Four times a day (QID) | INTRAMUSCULAR | Status: DC | PRN

## 2014-09-04 MED ORDER — ONDANSETRON HCL 4 MG/2ML IJ SOLN
4.0000 mg | Freq: Once | INTRAMUSCULAR | Status: AC
  Administered 2014-09-04: 4 mg via INTRAVENOUS
  Filled 2014-09-04: qty 2

## 2014-09-04 MED ORDER — HYDROCODONE-ACETAMINOPHEN 10-325 MG PO TABS
1.0000 | ORAL_TABLET | Freq: Three times a day (TID) | ORAL | Status: DC | PRN
  Administered 2014-09-05 – 2014-09-06 (×2): 1 via ORAL
  Filled 2014-09-04 (×2): qty 1

## 2014-09-04 MED ORDER — SODIUM CHLORIDE 0.9 % IJ SOLN
3.0000 mL | Freq: Two times a day (BID) | INTRAMUSCULAR | Status: DC
  Administered 2014-09-04 – 2014-09-06 (×2): 3 mL via INTRAVENOUS

## 2014-09-04 MED ORDER — DICYCLOMINE HCL 20 MG PO TABS
20.0000 mg | ORAL_TABLET | Freq: Two times a day (BID) | ORAL | Status: DC
  Administered 2014-09-04 – 2014-09-06 (×4): 20 mg via ORAL
  Filled 2014-09-04 (×5): qty 1

## 2014-09-04 MED ORDER — PANTOPRAZOLE SODIUM 40 MG IV SOLR: 40.0000 mg | Freq: Two times a day (BID) | INTRAVENOUS | Status: DC

## 2014-09-04 MED ORDER — SODIUM CHLORIDE 0.9 % IV SOLN: Freq: Once | INTRAVENOUS | Status: DC

## 2014-09-04 MED ORDER — PANTOPRAZOLE SODIUM 40 MG IV SOLR
80.0000 mg | Freq: Once | INTRAVENOUS | Status: AC
  Administered 2014-09-04: 80 mg via INTRAVENOUS
  Filled 2014-09-04: qty 80

## 2014-09-04 MED ORDER — ACETAMINOPHEN 500 MG PO TABS
500.0000 mg | ORAL_TABLET | Freq: Once | ORAL | Status: AC
  Administered 2014-09-04: 500 mg via ORAL
  Filled 2014-09-04: qty 1

## 2014-09-04 MED ORDER — CETYLPYRIDINIUM CHLORIDE 0.05 % MT LIQD
7.0000 mL | Freq: Two times a day (BID) | OROMUCOSAL | Status: DC
  Administered 2014-09-05: 7 mL via OROMUCOSAL

## 2014-09-04 MED ORDER — ONDANSETRON HCL 4 MG PO TABS: 4.0000 mg | ORAL_TABLET | Freq: Four times a day (QID) | ORAL | Status: DC | PRN

## 2014-09-04 MED ORDER — SODIUM CHLORIDE 0.9 % IV SOLN
Freq: Once | INTRAVENOUS | Status: AC
  Administered 2014-09-04: 22:00:00 via INTRAVENOUS

## 2014-09-04 MED ORDER — PANTOPRAZOLE SODIUM 40 MG IV SOLR
40.0000 mg | Freq: Once | INTRAVENOUS | Status: AC
  Administered 2014-09-04: 40 mg via INTRAVENOUS
  Filled 2014-09-04: qty 40

## 2014-09-04 MED ORDER — SODIUM CHLORIDE 0.9 % IV BOLUS (SEPSIS)
500.0000 mL | Freq: Once | INTRAVENOUS | Status: AC
  Administered 2014-09-04: 500 mL via INTRAVENOUS

## 2014-09-04 MED ORDER — ALBUTEROL SULFATE (2.5 MG/3ML) 0.083% IN NEBU: 2.5000 mg | INHALATION_SOLUTION | RESPIRATORY_TRACT | Status: DC | PRN

## 2014-09-04 MED ORDER — SODIUM CHLORIDE 0.9 % IV BOLUS (SEPSIS)
250.0000 mL | Freq: Once | INTRAVENOUS | Status: AC
  Administered 2014-09-04: 250 mL via INTRAVENOUS

## 2014-09-04 NOTE — ED Notes (Signed)
Pt c/o epigastric pain, onset approx 3 weeks ago.  Was seen here for same on 2/26 for same but had diarrhea at that time.  Pt st's diarrhea has subsided but continues to have pain

## 2014-09-04 NOTE — ED Notes (Signed)
Pt verbalized understanding of blood transfusion, signed consent.

## 2014-09-04 NOTE — ED Notes (Signed)
Attempted to call report

## 2014-09-04 NOTE — Progress Notes (Signed)
Spoke to NP Baltazar Najjar about temp and pulse prior to blood- most recent temp is 100.6- told to start transfusion.

## 2014-09-04 NOTE — Progress Notes (Signed)
NP notified about temp after 15 mins of giving blood- awaiting any further orders.

## 2014-09-04 NOTE — H&P (Signed)
PATIENT DETAILS Name: Tammy Kirby Age: 64 y.o. Sex: female Date of Birth: 09-Jul-1950 Admit Date: 09/04/2014 PCP:No primary care provider on file. Referring Physician: Dr. Eulis Foster   CHIEF COMPLAINT:  Epigastric pain worse for the past 2-3 days-ongoing for the past 3 weeks Melanotic stools 7 days Lightheadedness/dizziness/generalized weakness-2-3 days  HPI: Tammy Kirby is a 64 y.o. female with a Past Medical History of hypertension, prior CT evidence of a thoracic aneurysm (unchanged by CT done on 09/01/14), chronic back pain on narcotics and NSAIDs who presents today with the above noted complaint. Per patient, for the past 3 weeks she has been having epigastric pain. This pain has acutely worsened in the past 2-3 days. This was associated with black colored loose stools. Occasionally stools were maroonish in color. She has not had a bowel movement for the past 48 hours. Over the past 2-3 days she's developed generalized weakness, dizziness upon standing up and ambulation. She feels very fatigued and some mild exertional dyspnea on walking. She was seen in the emergency room on 6/26 with similar complaints, however her hemoglobin then was 11.6, she was also evaluated with a CT scan of the abdomen with contrast which showed mild inflammation around the pylorus/duodenal bulb suspicious for peptic ulcer disease, she was also seen to have a descending thoracic aortic aneurysm measuring 3.8 cm which was essentially unchanged from CT scan done on 08/16/2011. She was subsequently sent home to follow-up with the PCP and a vascular surgeon. No history of vomiting, but she does feel nauseous. History of fever or headache. No history of chest pain. She does have some exertional shortness of breath on ambulation.   ALLERGIES:   Allergies  Allergen Reactions  . Penicillins Other (See Comments)    Reaction unknown  . Sulfonamide Derivatives Other (See Comments)    Reaction  unknown    PAST MEDICAL HISTORY: Past Medical History  Diagnosis Date  . Hypertension   . Hypercholesteremia     PAST SURGICAL HISTORY: Past Surgical History  Procedure Laterality Date  . Cholecystectomy      MEDICATIONS AT HOME: Prior to Admission medications   Medication Sig Start Date End Date Taking? Authorizing Provider  amLODipine (NORVASC) 10 MG tablet Take 10 mg by mouth daily.   Yes Historical Provider, MD  CINNAMON PO Take 1 capsule by mouth daily.   Yes Historical Provider, MD  dicyclomine (BENTYL) 20 MG tablet Take 1 tablet (20 mg total) by mouth 2 (two) times daily. 09/01/14  Yes Kaitlyn Szekalski, PA-C  Flaxseed, Linseed, (FLAXSEED OIL PO) Take 1 tablet by mouth daily.   Yes Historical Provider, MD  HYDROcodone-acetaminophen (NORCO) 10-325 MG per tablet Take 1 tablet by mouth every 8 (eight) hours as needed. For pain   Yes Historical Provider, MD  loperamide (IMODIUM) 2 MG capsule Take 1 capsule (2 mg total) by mouth 4 (four) times daily as needed for diarrhea or loose stools. 09/01/14  Yes Alvina Chou, PA-C  Multiple Vitamin (MULTIVITAMIN WITH MINERALS) TABS Take 1 tablet by mouth daily.   Yes Historical Provider, MD  naproxen (NAPROSYN) 500 MG tablet Take 500 mg by mouth 2 (two) times daily with a meal.   Yes Historical Provider, MD  ondansetron (ZOFRAN ODT) 4 MG disintegrating tablet Take 1 tablet (4 mg total) by mouth every 8 (eight) hours as needed for nausea or vomiting. 09/01/14  Yes Alvina Chou, PA-C    FAMILY HISTORY: Father had asbestosis Mother had  emphysema.  SOCIAL HISTORY:  reports that she has been smoking.  She does not have any smokeless tobacco history on file. She reports that she does not drink alcohol or use illicit drugs. Lives at: Home Mobility: Independent  REVIEW OF SYSTEMS:  Constitutional:   No  weight loss, night sweats,  Fevers, chills,  HEENT:    No headaches, Dysphagia,Tooth/dental problems,Sore throat,    Cardio-vascular: No chest pain,Orthopnea, PND,lower extremity edema, anasarca, palpitations  GI:  No, vomiting,  hematochezia  Resp: No  cough, hemoptysis,plueritic chest pain.   Skin:  No rash or lesions.  GU:  No dysuria, change in color of urine, no urgency or frequency.  No flank pain.  Musculoskeletal: No joint pain or swelling.  No decreased range of motion.   Endocrine: No heat intolerance, no cold intolerance, no polyuria, no polydipsia  Psych: No change in mood or affect. No depression or anxiety.  No memory loss.   PHYSICAL EXAM: Blood pressure 120/48, pulse 102, temperature 98.6 F (37 C), temperature source Oral, resp. rate 24, height 5\' 1"  (1.549 m), weight 85.73 kg (189 lb), SpO2 100 %.  General appearance :Awake, alert, not in any distress. Speech Clear. Not toxic Looking. Looks very pale HEENT: Atraumatic and Normocephalic, pupils equally reactive to light and accomodation Neck: supple, no JVD. No cervical lymphadenopathy.  Chest:Good air entry bilaterally, no added sounds  CVS: S1 S2 regular, no murmurs.  Abdomen: Bowel sounds present, patient is mildly tender in the epigastric area without rebound, rigidity or guarding. Abdomen is very soft. Extremities: B/L Lower Ext shows no edema, both legs are warm to touch Neurology:  Non focal Skin:No Rash Wounds:N/A  LABS ON ADMISSION:   Recent Labs  09/04/14 1627  NA 133*  K 3.3*  CL 101  CO2 23  GLUCOSE 115*  BUN 6  CREATININE 0.79  CALCIUM 8.1*    Recent Labs  09/04/14 1627  AST 17  ALT 14  ALKPHOS 62  BILITOT 0.4  PROT 5.4*  ALBUMIN 2.5*   No results for input(s): LIPASE, AMYLASE in the last 72 hours.  Recent Labs  09/04/14 1627  WBC 12.2*  NEUTROABS 9.9*  HGB 6.4*  HCT 19.3*  MCV 90.2  PLT 329   No results for input(s): CKTOTAL, CKMB, CKMBINDEX, TROPONINI in the last 72 hours. No results for input(s): DDIMER in the last 72 hours. Invalid input(s): POCBNP   RADIOLOGIC  STUDIES ON ADMISSION: No results found.  ASSESSMENT AND PLAN: Present on Admission:  . UGI bleed: Given the fact she takes naproxen and possible duodenal/pyloric ulcer seen in recent CT-highly suspicious for bleeding peptic ulcer disease. Thankfully she has had no melanotic/hematochezia for the past 48 hours. We will place her on PPI infusion, transfused 3 units of PRBC. I have spoken with Dr. Stacie Glaze gastroenterology on call, we will keep nothing by mouth post midnight, she would likely need EGD in a.m. Although she does have a descending thoracic aneurysm (with either mural thrombus vs dissection), per CT reading this appears to be unchanged-very low suspicion for a aorto- gastric fistula at this time. We will monitor closely in a telemetry unit.  . Acute blood loss anemia: Secondary to above. We'll transfuse total of 3 units of PRBC. Monitor CBC frequently and transfuse as needed.   Marland Kitchen HYPERTENSION, BENIGN ESSENTIAL: Hold amlodipine, resume when bleeding has resolved. She will likely need to be either switched to a beta blocker or a beta blocker will need to be added to her  regimen given CT findings of a thoracic aneurysm. Blood pressure currently stable  . Chronic back pain/chronic pain syndrome: Continue narcotics as needed.   . Thoracic aortic aneurysm: Please see CT abdomen on 6/27. Per CT report-this is essentially unchanged from prior CT done in 2013. She will need vascular surgery referral once she is discharged. She will likely need to be placed on beta blockers, and will need tight control of her blood pressure.  .2.3 cm right adrenal mass: which is unchanged from 08/12/2011 and which has been characterized as a benign adenoma on MRI.   Further plan will depend as patient's clinical course evolves and further radiologic and laboratory data become available. Patient will be monitored closely.  Please note above plan was formulated after personal review and summarization of most  recent inpatient/outpatient records.  Plan of care was also discussed with the consultant-Dr Outlaw with recommendations as stated above.  Above noted plan was discussed with patient/family  face to face at bedside, they were in agreement.   CONSULTS: GI  DVT Prophylaxis: SCD's  Code Status: Full Code  Disposition Plan:  Discharge back hom in 2-3 days  Total time spent  55 minutes.Greater than 50% of this time was spent in counseling, explanation of diagnosis, planning of further management, and coordination of care.  [facility] Pager [phone]  If 7PM-7AM, please contact night-coverage [email or web address] Password [facility] 09/04/2014, 6:51 PM

## 2014-09-04 NOTE — Progress Notes (Signed)
Received report

## 2014-09-04 NOTE — Progress Notes (Signed)
  Pt admitted to the unit. Pt is stable, alert and oriented per baseline. Oriented to room, staff, and call bell. Educated to call for any assistance. Bed in lowest position, call bell within reach- will continue to monitor. 

## 2014-09-04 NOTE — ED Provider Notes (Signed)
CSN: [id number]     Arrival date & time 09/04/14  1554 History   First MD Initiated Contact with Patient 09/04/14 1755     Chief Complaint  Patient presents with  . Abdominal Pain     (Consider location/radiation/quality/duration/timing/severity/associated sxs/prior Treatment) HPI   Tammy Kirby is a 64 y.o. female who presents for evaluation of upper abdominal pain. The pain is been present for several days. Initially it was accompanied by diarrhea, which was brown to red in color. The diarrhea has abated in the last 48 hours. The abdominal pain continues. It is an achy sensation. She has decreased appetite for several days. She has mild dizziness with ambulation. She was referred to vascular surgery because of abdominal aneurysm found on CT imaging on 08/31/2014. She has not had that visit yet. She denies chest pain or back pain. There is no lower abdominal pain. There are no other known modifying factors.   Past Medical History  Diagnosis Date  . Hypertension   . Hypercholesteremia    Past Surgical History  Procedure Laterality Date  . Cholecystectomy     No family history on file. History  Substance Use Topics  . Smoking status: Current Some Day Smoker  . Smokeless tobacco: Not on file  . Alcohol Use: No   OB History    No data available     Review of Systems  All other systems reviewed and are negative.     Allergies  Penicillins and Sulfonamide derivatives  Home Medications   Prior to Admission medications   Medication Sig Start Date End Date Taking? Authorizing Provider  amLODipine (NORVASC) 10 MG tablet Take 10 mg by mouth daily.    Historical Provider, MD  CINNAMON PO Take 1 capsule by mouth daily.    Historical Provider, MD  dicyclomine (BENTYL) 20 MG tablet Take 1 tablet (20 mg total) by mouth 2 (two) times daily. 09/01/14   Kaitlyn Szekalski, PA-C  Flaxseed, Linseed, (FLAXSEED OIL PO) Take 1 tablet by mouth daily.    Historical Provider, MD   HYDROcodone-acetaminophen (NORCO) 10-325 MG per tablet Take 1 tablet by mouth every 8 (eight) hours as needed. For pain    Historical Provider, MD  loperamide (IMODIUM) 2 MG capsule Take 1 capsule (2 mg total) by mouth 4 (four) times daily as needed for diarrhea or loose stools. 09/01/14   Alvina Chou, PA-C  Multiple Vitamin (MULTIVITAMIN WITH MINERALS) TABS Take 1 tablet by mouth daily.    Historical Provider, MD  naproxen (NAPROSYN) 500 MG tablet Take 500 mg by mouth 2 (two) times daily with a meal.    Historical Provider, MD  ondansetron (ZOFRAN ODT) 4 MG disintegrating tablet Take 1 tablet (4 mg total) by mouth every 8 (eight) hours as needed for nausea or vomiting. 09/01/14   Alvina Chou, PA-C  OVER THE COUNTER MEDICATION Take 1 tablet by mouth daily. Antioxidant supplement    Historical Provider, MD  OVER THE COUNTER MEDICATION Take 1 tablet by mouth daily. Trace mineral supplement    Historical Provider, MD   BP 139/46 mmHg  Pulse 101  Temp(Src) 98.6 F (37 C) (Oral)  Resp 30  Ht 5\' 1"  (1.549 m)  Wt 189 lb (85.73 kg)  BMI 35.73 kg/m2  SpO2 100% Physical Exam  Constitutional: She is oriented to person, place, and time. She appears well-developed.  Obese. She appears older than stated age.  HENT:  Head: Normocephalic and atraumatic.  Right Ear: External ear normal.  Left Ear:  External ear normal.  Eyes: Conjunctivae and EOM are normal. Pupils are equal, round, and reactive to light.  Neck: Normal range of motion and phonation normal. Neck supple.  Cardiovascular: Normal rate, regular rhythm and normal heart sounds.   Pulmonary/Chest: Effort normal and breath sounds normal. She exhibits no bony tenderness.  Abdominal: Soft. She exhibits no mass. There is tenderness (epigastric and right upper quadrant, mild.). There is no rebound and no guarding.  Genitourinary:  Stool, black, per nursing  Musculoskeletal: Normal range of motion. She exhibits no edema or tenderness.   Neurological: She is alert and oriented to person, place, and time. No cranial nerve deficit or sensory deficit. She exhibits normal muscle tone. Coordination normal.  Skin: Skin is warm, dry and intact.  Psychiatric: She has a normal mood and affect. Her behavior is normal. Judgment and thought content normal.  Nursing note and vitals reviewed.   ED Course  Procedures (including critical care time)  Medications  0.9 %  sodium chloride infusion (not administered)    Patient Vitals for the past 24 hrs:  BP Temp Temp src Pulse Resp SpO2 Height Weight  09/04/14 1725 (!) 139/46 mmHg - - 101 (!) 30 100 % - -  09/04/14 1609 129/63 mmHg 98.6 F (37 C) Oral 104 18 98 % 5\' 1"  (1.549 m) 189 lb (85.73 kg)   6:10 PM-Consult complete with Hospitalist. Patient case explained and discussed. He agrees to admit patient for further evaluation and treatment. Call ended at 18:17  18:12- paged gastroenterology  6:31 PM Reevaluation with update and discussion. After initial assessment and treatment, an updated evaluation reveals no change in clinical status. At this time. [name]   CRITICAL CARE Performed by: Daleen Bo L Total critical care time: 35 minutes Critical care time was exclusive of separately billable procedures and treating other patients. Critical care was necessary to treat or prevent imminent or life-threatening deterioration. Critical care was time spent personally by me on the following activities: development of treatment plan with patient and/or surrogate as well as nursing, discussions with consultants, evaluation of patient's response to treatment, examination of patient, obtaining history from patient or surrogate, ordering and performing treatments and interventions, ordering and review of laboratory studies, ordering and review of radiographic studies, pulse oximetry and re-evaluation of patient's condition.   Labs Review Labs Reviewed  CBC WITH  DIFFERENTIAL/PLATELET - Abnormal; Notable for the following:    WBC 12.2 (*)    RBC 2.14 (*)    Hemoglobin 6.4 (*)    HCT 19.3 (*)    Neutrophils Relative % 81 (*)    Neutro Abs 9.9 (*)    All other components within normal limits  COMPREHENSIVE METABOLIC PANEL - Abnormal; Notable for the following:    Sodium 133 (*)    Potassium 3.3 (*)    Glucose, Bld 115 (*)    Calcium 8.1 (*)    Total Protein 5.4 (*)    Albumin 2.5 (*)    All other components within normal limits  POC OCCULT BLOOD, ED  ABO/RH  PREPARE RBC (CROSSMATCH)    Imaging Review No results found.   EKG Interpretation None      MDM   Final diagnoses:  Anemia, unspecified anemia type  Peptic ulcer disease    Abdominal pain is consistent with peptic ulcer disease, and likely internal bleeding secondary to that leading to anemia. She is hemodynamically stable at this time. Abdominal aortic aneurysm found on recent evaluation, is unlikely to be bleeding  at this time. She is not anticoagulated. She will need to be admitted for monitoring blood transfusion and likely diagnostic endoscopy.  Nursing Notes Reviewed/ Care Coordinated Applicable Imaging Reviewed Interpretation of Laboratory Data incorporated into ED treatment   Plan: Admit  Daleen Bo, MD 09/04/14 2341

## 2014-09-05 ENCOUNTER — Encounter (HOSPITAL_COMMUNITY)
Admission: EM | Disposition: A | Discharge status: Home / Self Care | Payer: Self-pay | Source: Home / Self Care | Attending: Internal Medicine

## 2014-09-05 ENCOUNTER — Encounter (HOSPITAL_COMMUNITY): Payer: Self-pay | Admitting: *Deleted

## 2014-09-05 ENCOUNTER — Inpatient Hospital Stay (HOSPITAL_COMMUNITY): Payer: Medicare Other | Admitting: Certified Registered Nurse Anesthetist

## 2014-09-05 DIAGNOSIS — D62 Acute posthemorrhagic anemia: Secondary | ICD-10-CM

## 2014-09-05 DIAGNOSIS — M5137 Other intervertebral disc degeneration, lumbosacral region: Secondary | ICD-10-CM

## 2014-09-05 DIAGNOSIS — K922 Gastrointestinal hemorrhage, unspecified: Secondary | ICD-10-CM

## 2014-09-05 HISTORY — PX: ESOPHAGOGASTRODUODENOSCOPY (EGD) WITH PROPOFOL: SHX5813

## 2014-09-05 LAB — BASIC METABOLIC PANEL
Anion gap: 11 (ref 5–15)
BUN: 7 mg/dL (ref 6–20)
CO2: 22 mmol/L (ref 22–32)
Calcium: 7.7 mg/dL — ABNORMAL LOW (ref 8.9–10.3)
Chloride: 104 mmol/L (ref 101–111)
Creatinine, Ser: 0.88 mg/dL (ref 0.44–1.00)
GFR calc non Af Amer: >60 mL/min (ref >60)
GLUCOSE: 91 mg/dL (ref 65–99)
Potassium: 3.6 mmol/L (ref 3.5–5.1)
Sodium: 137 mmol/L (ref 135–145)

## 2014-09-05 LAB — URINE MICROSCOPIC-ADD ON

## 2014-09-05 LAB — CBC
HCT: 23.4 % — ABNORMAL LOW (ref 36.0–46.0)
HCT: 25.8 % — ABNORMAL LOW (ref 36.0–46.0)
HEMOGLOBIN: 7.8 g/dL — AB (ref 12.0–15.0)
Hemoglobin: 8.6 g/dL — ABNORMAL LOW (ref 12.0–15.0)
MCH: 29.1 pg (ref 26.0–34.0)
MCH: 29.1 pg (ref 26.0–34.0)
MCHC: 33.3 g/dL (ref 30.0–36.0)
MCHC: 33.3 g/dL (ref 30.0–36.0)
MCV: 87.3 fL (ref 78.0–100.0)
MCV: 87.2 fL (ref 78.0–100.0)
PLATELETS: 256 10*3/uL (ref 150–400)
Platelets: 247 10*3/uL (ref 150–400)
RBC: 2.68 MIL/uL — AB (ref 3.87–5.11)
RBC: 2.96 MIL/uL — ABNORMAL LOW (ref 3.87–5.11)
RDW: 16.1 % — AB (ref 11.5–15.5)
RDW: 16.4 % — ABNORMAL HIGH (ref 11.5–15.5)
WBC: 10.4 10*3/uL (ref 4.0–10.5)
WBC: 9.1 10*3/uL (ref 4.0–10.5)

## 2014-09-05 LAB — URINALYSIS, ROUTINE W REFLEX MICROSCOPIC
Bilirubin Urine: NEGATIVE
GLUCOSE, UA: NEGATIVE mg/dL
HGB URINE DIPSTICK: NEGATIVE
Ketones, ur: 15 mg/dL — AB
Nitrite: NEGATIVE
PH: 6 (ref 5.0–8.0)
Protein, ur: NEGATIVE mg/dL
SPECIFIC GRAVITY, URINE: 1.008 (ref 1.005–1.030)
Urobilinogen, UA: 0.2 mg/dL (ref 0.0–1.0)

## 2014-09-05 LAB — GLUCOSE, CAPILLARY
Glucose-Capillary: 83 mg/dL (ref 65–99)
Glucose-Capillary: 87 mg/dL (ref 65–99)
Glucose-Capillary: 115 mg/dL — ABNORMAL HIGH (ref 65–99)

## 2014-09-05 LAB — PREPARE RBC (CROSSMATCH)

## 2014-09-05 SURGERY — ESOPHAGOGASTRODUODENOSCOPY (EGD) WITH PROPOFOL
Anesthesia: Monitor Anesthesia Care

## 2014-09-05 MED ORDER — SODIUM CHLORIDE 0.9 % IV SOLN
Freq: Once | INTRAVENOUS | Status: AC
  Administered 2014-09-05: 200 mL via INTRAVENOUS

## 2014-09-05 MED ORDER — LACTATED RINGERS IV SOLN
INTRAVENOUS | Status: DC | PRN
Start: 2014-09-05 — End: 2014-09-05
  Administered 2014-09-05: 13:00:00 via INTRAVENOUS

## 2014-09-05 MED ORDER — DIPHENHYDRAMINE HCL 50 MG/ML IJ SOLN
25.0000 mg | Freq: Once | INTRAMUSCULAR | Status: AC
  Administered 2014-09-05: 25 mg via INTRAVENOUS
  Filled 2014-09-05: qty 1

## 2014-09-05 MED ORDER — PROPOFOL 10 MG/ML IV BOLUS
INTRAVENOUS | Status: DC | PRN
  Administered 2014-09-05: 10 mg via INTRAVENOUS
  Administered 2014-09-05: 30 mg via INTRAVENOUS
  Administered 2014-09-05: 40 mg via INTRAVENOUS
  Administered 2014-09-05: 20 mg via INTRAVENOUS
  Administered 2014-09-05: 50 mg via INTRAVENOUS
  Administered 2014-09-05: 30 mg via INTRAVENOUS
  Administered 2014-09-05: 10 mg via INTRAVENOUS
  Administered 2014-09-05: 20 mg via INTRAVENOUS

## 2014-09-05 MED ORDER — LIDOCAINE HCL (CARDIAC) 20 MG/ML IV SOLN
INTRAVENOUS | Status: DC | PRN
  Administered 2014-09-05: 40 mg via INTRAVENOUS

## 2014-09-05 MED ORDER — DIPHENHYDRAMINE HCL 25 MG PO CAPS
25.0000 mg | ORAL_CAPSULE | Freq: Four times a day (QID) | ORAL | Status: DC | PRN
  Administered 2014-09-05: 25 mg via ORAL
  Filled 2014-09-05: qty 1

## 2014-09-05 NOTE — Consult Note (Signed)
Referring Provider:   Dr. Domenic Polite ([facility]) Primary Care Physician:  Dr. Haskel Schroeder Primary Gastroenterologist:  None (unassigned)  Reason for Consultation:  GI bleeding.   HPI: Tammy Kirby is a 64 y.o. 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 It finally stopped several days ago. During this time, she became weak but never passed out. She was having some epigastric discomfort.  She was seen in the emergency room 5 days ago at which time BUN was elevated at 37 and her hemoglobin had dropped from a baseline of 14.7 several years ago to 11.6. She was admitted through the emergency room last night when she presented with further abdominal pain and was found to have a further significant drop in hemoglobin to 6.4 (MCV 90). However, her BUN at that time, and again this morning, is well within the normal range, suggesting cessation of her bleeding. She has received a total of 2 units of packed red cells, with a hemoglobin rising appropriately to 7.8 after the first unit.   Past Medical History  Diagnosis Date  . Hypertension   . Hypercholesteremia     Past Surgical History  Procedure Laterality Date  . Cholecystectomy      Prior to Admission medications   Medication Sig Start Date End Date Taking? Authorizing Provider  amLODipine (NORVASC) 10 MG tablet Take 10 mg by mouth daily.   Yes Historical Provider, MD  CINNAMON PO Take 1 capsule by mouth daily.   Yes Historical Provider, MD  dicyclomine (BENTYL) 20 MG tablet Take 1 tablet (20 mg total) by mouth 2 (two) times daily. 09/01/14  Yes Kaitlyn Szekalski, PA-C  Flaxseed, Linseed, (FLAXSEED OIL PO) Take 1 tablet by mouth daily.   Yes Historical Provider, MD  HYDROcodone-acetaminophen  (NORCO) 10-325 MG per tablet Take 1 tablet by mouth every 8 (eight) hours as needed. For pain   Yes Historical Provider, MD  loperamide (IMODIUM) 2 MG capsule Take 1 capsule (2 mg total) by mouth 4 (four) times daily as needed for diarrhea or loose stools. 09/01/14  Yes Alvina Chou, PA-C  Multiple Vitamin (MULTIVITAMIN WITH MINERALS) TABS Take 1 tablet by mouth daily.   Yes Historical Provider, MD  naproxen (NAPROSYN) 500 MG tablet Take 500 mg by mouth 2 (two) times daily with a meal.   Yes Historical Provider, MD  ondansetron (ZOFRAN ODT) 4 MG disintegrating tablet Take 1 tablet (4 mg total) by mouth every 8 (eight) hours as needed for nausea or vomiting. 09/01/14  Yes Alvina Chou, PA-C    Current Facility-Administered Medications  Medication Dose Route Frequency Provider Last Rate Last Dose  . [MAR Hold] acetaminophen (TYLENOL) tablet 650 mg  650 mg Oral Q6H PRN Jonetta Osgood, MD   650 mg at 09/05/14 0950   Or  . [MAR Hold] acetaminophen (TYLENOL) suppository 650 mg  650 mg Rectal Q6H PRN Jonetta Osgood, MD      . Doug Sou Hold] albuterol (PROVENTIL) (2.5 MG/3ML) 0.083% nebulizer solution 2.5 mg  2.5 mg Nebulization Q2H PRN Shanker Kristeen Mans, MD      . Doug Sou Hold] antiseptic oral rinse (CPC / CETYLPYRIDINIUM CHLORIDE 0.05%) solution 7 mL  7 mL Mouth Rinse q12n4p Shanker Kristeen Mans, MD  7 mL at 09/05/14 1151  . [MAR Hold] chlorhexidine (PERIDEX) 0.12 % solution 15 mL  15 mL Mouth Rinse BID Jonetta Osgood, MD   15 mL at 09/05/14 0755  . [MAR Hold] dicyclomine (BENTYL) tablet 20 mg  20 mg Oral BID Jonetta Osgood, MD   20 mg at 09/05/14 1040  . [MAR Hold] diphenhydrAMINE (BENADRYL) capsule 25 mg  25 mg Oral Q6H PRN Domenic Polite, MD      . Doug Sou Hold] guaiFENesin-dextromethorphan (ROBITUSSIN DM) 100-10 MG/5ML syrup 5 mL  5 mL Oral Q4H PRN Shanker Kristeen Mans, MD      . Doug Sou Hold] HYDROcodone-acetaminophen (NORCO) 10-325 MG per tablet 1 tablet  1 tablet Oral Q8H PRN Shanker Kristeen Mans,  MD      . Doug Sou Hold] morphine 2 MG/ML injection 2 mg  2 mg Intravenous Q4H PRN Jonetta Osgood, MD   2 mg at 09/05/14 0753  . [MAR Hold] ondansetron (ZOFRAN) tablet 4 mg  4 mg Oral Q6H PRN Shanker Kristeen Mans, MD       Or  . Doug Sou Hold] ondansetron [facility]) injection 4 mg  4 mg Intravenous Q6H PRN Shanker Kristeen Mans, MD      . pantoprazole (PROTONIX) 80 mg in sodium chloride 0.9 % 250 mL (0.32 mg/mL) infusion  8 mg/hr Intravenous Continuous Jonetta Osgood, MD 25 mL/hr at 09/05/14 0843 8 mg/hr at 09/05/14 0843  . [MAR Hold] pantoprazole (PROTONIX) injection 40 mg  40 mg Intravenous Q12H Shanker Kristeen Mans, MD      . sodium chloride 0.9 % 1,000 mL with potassium chloride 40 mEq infusion   Intravenous Continuous Domenic Polite, MD 10 mL/hr at 09/05/14 1005    . [MAR Hold] sodium chloride 0.9 % injection 3 mL  3 mL Intravenous Q12H Jonetta Osgood, MD   3 mL at 09/04/14 2129    Allergies as of 09/04/2014 - Review Complete 09/04/2014  Allergen Reaction Noted  . Penicillins Other (See Comments) 07/27/2007  . Sulfonamide derivatives Other (See Comments) 07/27/2007    No family history on file.  History   Social History  . Marital Status: Single    Spouse Name: N/A  . Number of Children: N/A  . Years of Education: N/A   Occupational History  . Not on file.   Social History Main Topics  . Smoking status: Current Some Day Smoker  . Smokeless tobacco: Not on file  . Alcohol Use: No  . Drug Use: No  . Sexual Activity: Not on file   Other Topics Concern  . Not on file   Social History Narrative    Review of Systems: Her main problem has been the leg pain, necessitating the above-mentioned use of naproxen. No dysphagia, reflux, anorexia, unintentional weight loss, chronic abdominal pain, chronic constipation or diarrhea, no chronic or ongoing pulmonary, cardiac, urinary, joint, or cutaneous complaints.  Physical Exam: Vital signs in last 24 hours: Temp:  [98.3 F (36.8 C)-101 F  (38.3 C)] 98.6 F (37 C) (07/01 1221) Pulse Rate:  [75-111] 75 (07/01 1221) Resp:  [18-30] 18 (07/01 1221) BP: (106-154)/(46-87) 142/80 mmHg (07/01 1221) SpO2:  [91 %-100 %] 99 % (07/01 1221) Weight:  [85.73 kg (189 lb)] 85.73 kg (189 lb) (06/30 1609) Last BM Date: 09/01/14 General:   Alert,  Well-developed, severely overweight, pleasant and cooperative in NAD Head:  Normocephalic and atraumatic. Eyes:  Sclera clear, no icterus.   Conjunctiva pale. Mouth:   No ulcerations or lesions.  Oropharynx pink & moist. Neck:   No masses or thyromegaly. Lungs:  Clear throughout to auscultation.   No wheezes, crackles, or rhonchi. No evident respiratory distress. Heart:   Regular rate and rhythm; no murmurs, clicks, rubs,  or gallops. Abdomen:  Soft, nontender, nontympanitic, and nondistended but quite adipose. No masses, hepatosplenomegaly or ventral hernias noted.  Rectal:  Not performed, but stool Hemoccult-positive in the emergency room   Msk:   Symmetrical without gross deformities. Pulses:  Normal radial pulse is noted. Extremities:   Without clubbing, cyanosis, or edema. Neurologic:  Alert and coherent;  grossly normal neurologically. Skin:  Intact without significant lesions or rashes. Cervical Nodes:  No significant cervical adenopathy. Psych:   Alert and cooperative. Normal mood and affect.  Intake/Output from previous day: 06/30 0701 - 07/01 0700 In: 830 [Blood:730; IV Piggyback:100] Out: 2725 [Urine:1650] Intake/Output this shift: Total I/O In: 660 [Blood:660] Out: -   Lab Results:  Recent Labs  09/04/14 1627 09/04/14 2108 09/05/14 0643  WBC 12.2* 14.1* 10.4  HGB 6.4* 6.8* 7.8*  HCT 19.3* 20.5* 23.4*  PLT 329 328 247   BMET  Recent Labs  09/04/14 1627 09/05/14 0441  NA 133* 137  K 3.3* 3.6  CL 101 104  CO2 23 22  GLUCOSE 115* 91  BUN 6 7  CREATININE 0.79 0.88  CALCIUM 8.1* 7.7*   LFT  Recent Labs  09/04/14 1627  PROT 5.4*  ALBUMIN 2.5*  AST 17   ALT 14  ALKPHOS 62  BILITOT 0.4   PT/INR No results for input(s): LABPROT, INR in the last 72 hours.  Studies/Results: No results found.  Impression: 1. Subacute GI bleeding which appears to have stopped as of several days ago, most likely due to gastropathy from non-steroidal anti-inflammatory drug exposure. No current hemodynamic instability. 2. Posthemorrhagic anemia, severe, acute, now status post transfusion  Plan: 1. Endoscopy this afternoon. Petra Kuba, purpose, risks reviewed. Patient agreeable. We will hopefully be able to do it under propofol sedation in view of her chronic hydrocodone usage. 2. The patient is aware that she is a candidate for screening colonoscopy. Apparently she was sent a letter with an appointment to arrange that exam, but never followed through on it. I have given her my card and suggested that she call my office at her convenience to arrange that.   LOS: 1 day   Fina Heizer V  09/05/2014, 1:03 PM   Pager [phone] If no answer or after 5 PM call [phone]

## 2014-09-05 NOTE — Progress Notes (Signed)
Patient's endoscopy showed a large clean-based duodenal ulcer, with no blood resident in the upper tract.  Please see dictated report for further details and recommendations.  I think the patient might be able to go home tomorrow, if she tolerates a solid diet, which I have ordered.  I will sign off; please call us if the patient has problems or if we can be of further assistance in her care.  My office will contact the patient by phone to give her an appointment for a follow-up visit with me in a month or so, at which time we could arrange a screening colonoscopy (if she is willing-at the moment, she is not sure she wants one).  We will also follow up with the patient's biopsy results (I'm checking for Helicobacter pylori infection) and we will initiate treatment for that if present.  Cleotis Nipper, M.D. Pager [phone] If no answer or after 5 PM call [phone]

## 2014-09-05 NOTE — Anesthesia Postprocedure Evaluation (Signed)
  Anesthesia Post-op Note  Patient: Tammy Kirby  Procedure(s) Performed: Procedure(s): ESOPHAGOGASTRODUODENOSCOPY (EGD) WITH PROPOFOL (N/A)  Patient Location: PACU  Anesthesia Type:MAC  Level of Consciousness: awake  Airway and Oxygen Therapy: Patient Spontanous Breathing  Post-op Pain: mild  Post-op Assessment: Post-op Vital signs reviewed, Patient's Cardiovascular Status Stable, Respiratory Function Stable, Patent Airway, No signs of Nausea or vomiting and Pain level controlled              Post-op Vital Signs: Reviewed and stable  Last Vitals:  Filed Vitals:   09/05/14 1400  BP: 147/58  Pulse: 78  Temp:   Resp: 17    Complications: No apparent anesthesia complications

## 2014-09-05 NOTE — Progress Notes (Signed)
Spoke with patient to verify PCP. Dr Bartolo Darter in [place] at [place] is who follows her. She lives in a mobile home very close to children. Family is supportive, patient does not for see any home health needs. Will continue to follow.

## 2014-09-05 NOTE — Progress Notes (Signed)
[facility] PROGRESS NOTE  Tammy Kirby:[id number] DOB: 30-Apr-1950 DOA: 09/04/2014 PCP: No primary care provider on file.  Assessment/Plan: UGI bleed/Melena -suspect PUD, no melena for 2days now, h/o NSAID use -Continue IV PPI -[facility] to see, D/w Dr.Buccini -do not suspect Aorto-enteric fistula, CT unchanged in 3years, compared to MR abdomen 6/13  . Acute blood loss anemia:  -s/p 2units PRBC overnight, transfuse 1 unit today -monitor Hb.   Marland Kitchen HYPERTENSION, BENIGN ESSENTIAL:  -Hold amlodipine, -start labetalol, due to Aortic aneurysm  . Chronic back pain/chronic pain syndrome:  -continue narcotics as needed.   . 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   DVT Prophylaxis: SCD's  Code Status: Full Code Family Communication: none at bedside Disposition Plan: home in 1-2days   Consultants:  GI pending  HPI/Subjective: Feels better after getting blood, denies any melena at this time  Objective: Filed Vitals:   09/05/14 0959  BP: 154/82  Pulse: 88  Temp: 98.3 F (36.8 C)  Resp: 18    Intake/Output Summary (Last 24 hours) at 09/05/14 1037 Last data filed at 09/05/14 0959  Gross per 24 hour  Intake   1160 ml  Output   1650 ml  Net   -490 ml   Filed Weights   09/04/14 1609  Weight: 85.73 kg (189 lb)    Exam:   General:  AAOx3  Cardiovascular: S1S2/RRR  Respiratory: CTAB  Abdomen: mild epigastric tenderness, ND, BS present  Musculoskeletal: no edema c/c  Data Reviewed: Basic Metabolic Panel:  Recent Labs Lab 08/31/14 2239 09/04/14 1627 09/05/14 0441  NA 141 133* 137  K 3.6 3.3* 3.6  CL 108 101 104  CO2 23 23 22   GLUCOSE 131* 115* 91  BUN 37* 6 7  CREATININE 1.04* 0.79 0.88   CALCIUM 8.7* 8.1* 7.7*   Liver Function Tests:  Recent Labs Lab 08/31/14 2239 09/04/14 1627  AST 15 17  ALT 15 14  ALKPHOS 74 62  BILITOT 0.5 0.4  PROT 6.6 5.4*  ALBUMIN 3.0* 2.5*    Recent Labs Lab 08/31/14 2239  LIPASE 15*   No results for input(s): AMMONIA in the last 168 hours. CBC:  Recent Labs Lab 08/31/14 2239 09/04/14 1627 09/04/14 2108 09/05/14 0643  WBC 12.5* 12.2* 14.1* 10.4  NEUTROABS 9.3* 9.9*  --   --   HGB 11.6* 6.4* 6.8* 7.8*  HCT 34.9* 19.3* 20.5* 23.4*  MCV 89.9 90.2 90.7 87.3  PLT 371 329 328 247   Cardiac Enzymes: No results for input(s): CKTOTAL, CKMB, CKMBINDEX, TROPONINI in the last 168 hours. BNP (last 3 results) No results for input(s): BNP in the last 8760 hours.  ProBNP (last 3 results) No results for input(s): PROBNP in the last 8760 hours.  CBG:  Recent Labs Lab 09/05/14 0803  GLUCAP 83    No results found for this or any previous visit (from the past 240 hour(s)).   Studies: No results found.  Scheduled Meds: . antiseptic oral rinse  7 mL Mouth Rinse q12n4p  . chlorhexidine  15 mL Mouth Rinse BID  . dicyclomine  20 mg Oral BID  . [START ON 09/08/2014] pantoprazole (PROTONIX) IV  40 mg Intravenous Q12H  . sodium chloride  3 mL Intravenous Q12H  Continuous Infusions: . pantoprozole (PROTONIX) infusion 8 mg/hr (09/05/14 0843)  . sodium chloride 0.9 % 1,000 mL with potassium chloride 40 mEq infusion 125 mL/hr at 09/05/14 0802   Antibiotics Given (last 72 hours)    None      Principal Problem:   UGI bleed Active Problems:   HYPERTENSION, BENIGN ESSENTIAL   DISC DISEASE, LUMBAR   Acute blood loss anemia   Thoracic aortic aneurysm    Time spent: 77min    Tammy Kirby  [facility] Pager [phone]. If 7PM-7AM, please contact night-coverage at [email or web address], password [facility] 09/05/2014, 10:37 AM  LOS: 1 day

## 2014-09-05 NOTE — Transfer of Care (Signed)
Immediate Anesthesia Transfer of Care Note  Patient: Tammy Kirby  Procedure(s) Performed: Procedure(s): ESOPHAGOGASTRODUODENOSCOPY (EGD) WITH PROPOFOL (N/A)  Patient Location: PACU  Anesthesia Type:MAC  Level of Consciousness: awake, alert  and oriented  Airway & Oxygen Therapy: Patient Spontanous Breathing  Post-op Assessment: Report given to RN and Post -op Vital signs reviewed and stable  Post vital signs: Reviewed and stable  Last Vitals:  Filed Vitals:   09/05/14 1309  BP: 155/84  Pulse: 88  Temp: 37.2 C  Resp: 22    Complications: No apparent anesthesia complications

## 2014-09-05 NOTE — Care Management (Signed)
Utilization review completed. Maelys Kinnick, RN Case Manager [phone]. 

## 2014-09-05 NOTE — Op Note (Signed)
[facility] [place], [address]   ENDOSCOPY PROCEDURE REPORT  PATIENT: Tammy, Kirby  MR#: [id number] BIRTHDATE: 07-09-1950 , 32  yrs. old GENDER: female ENDOSCOPIST:Gaylord Seydel, MD REFERRED BY:  Dr. Luretha Rued PROCEDURE DATE:  09/05/2014 PROCEDURE:   Upper endoscopy with biopsy ASA CLASS:    III INDICATIONS: recent melenic stools and drop in hemoglobin to 6.4 in a patient on daily naproxen MEDICATION: propofol per Mac anesthesia TOPICAL ANESTHETIC:   Cetacaine spray  DESCRIPTION OF PROCEDURE:   After the risks and benefits of the procedure were explained, informed consent was obtained.  The patient was brought from her hospital room to the [facility] cone endoscopy unit. Time out was performed.  The Pentax adult video       endoscope was introduced through the mouth  and advanced to the second portion of the duodenum .  The instrument was slowly withdrawn as the mucosa was fully examined. Estimated blood loss is zero unless otherwise noted in this procedure report.    The larynx looked normal.  The esophagus was readily entered under direct vision and was normal in its entirety to the GE junction, where a widely patent Schatzki's ring was present, above a 2 cm hiatal hernia. No Mallory-Weiss tear, reflux esophagitis, varices, infection, Barrett's esophagus, or neoplasia were noted.  The stomach was entered. It contained no blood or coffee-ground material. The gastric mucosa was normal in all locations, despite her exposure to non-scrotal an inflammatory drugs. No gastritis, erosions, ulcers, polyps, or masses were observed.  Retroflexion showed a normal cardia of the stomach, with a small hiatal hernia from its inferior perspective.  The pylorus and duodenal bulb were normal, but on entering the proximal portion of the second duodenum, there was a very large deep but clean-based ulcer, occupying approximately 50% of  the circumference of the lumen. I was not able to readily slide the scope by this area to enter the more distal portions of the second duodenum, and I did not  want to force anything, for fear of perforating the ulcer.  I obtained antral biopsies to check for evidence of Helicobacter pylori infection prior to removal of the scope. The scope was then withdrawn from the patient and the procedure completed.  COMPLICATIONS: There were no immediate complications.  ENDOSCOPIC IMPRESSION: 1. No active bleeding or blood in the stomach at the time of this procedure 2. Large ulcer in proximal second duodenum   RECOMMENDATIONS: 1. Await pathology results. If Helicobacter pylori is present, I would favor treating it in view of the associated duodenal ulcer disease. 2. The patient needs to avoid non-steroidal's for at least the next month or so until the ulcer has had a chance to heal. Thereafter, such medications could be used if absolutely essential, as long as the patient is co-treated with a proton pump inhibitor for ulcer prophylaxis. 3. In view of the clean base on this ulcer, the patient appears to be at very little risk for further hemorrhage. Therefore, I will advance the patient's diet and would consider discharge tomorrow if she tolerates the diet okay. It does not appear that she has any functional obstruction from this ulcer, in view of the absence of any large gastric residual at the time of this exam. 4. I would probably treat this patient with twice-daily PPI therapy for the next month, and would continue once daily dosing thereafter for ulcer prophylaxis if the patient needs to go back on non-steroidal anti-inflammatory medication.  5. This patient needs a screening colonoscopy and I have encouraged her to contact my office for that purpose. At the time of that exam, consideration could be given to concurrent endoscopic evaluation to confirm ulcer healing, although that is  not essential since duodenal ulcers are almost always benign.   _______________________________ eSignedRonald Lobo, MD 2014/09/21 1:48 PM     cc:  CPT CODES: ICD CODES:  The ICD and CPT codes recommended by this software are interpretations from the data that the clinical staff has captured with the software.  The verification of the translation of this report to the ICD and CPT codes and modifiers is the sole responsibility of the health care institution and practicing physician where this report was generated.  [employer] will not be held responsible for the validity of the ICD and CPT codes included on this report.  AMA assumes no liability for data contained or not contained herein. CPT is a Designer, television/film set of the [employer].  PATIENT NAME:  Tammy, Kirby MR#: [id number]

## 2014-09-05 NOTE — Anesthesia Preprocedure Evaluation (Signed)
Anesthesia Evaluation  Patient identified by MRN, date of birth, ID band Patient awake    Reviewed: Allergy & Precautions, NPO status , Patient's Chart, lab work & pertinent test results  History of Anesthesia Complications Negative for: history of anesthetic complications  Airway Mallampati: I  TM Distance: >3 FB Neck ROM: Full    Dental  (+) Edentulous Upper, Edentulous Lower   Pulmonary neg shortness of breath, neg sleep apnea, neg COPDneg recent URI, Current Smoker,  breath sounds clear to auscultation        Cardiovascular hypertension, Pt. on medications - angina+ Peripheral Vascular Disease - Past MI and - CHF Rhythm:Regular     Neuro/Psych PSYCHIATRIC DISORDERS negative neurological ROS     GI/Hepatic negative GI ROS, Neg liver ROS,   Endo/Other  Morbid obesity  Renal/GU negative Renal ROS     Musculoskeletal  (+) Arthritis -,   Abdominal   Peds  Hematology  (+) anemia ,   Anesthesia Other Findings   Reproductive/Obstetrics                             Anesthesia Physical Anesthesia Plan  ASA: III  Anesthesia Plan: MAC   Post-op Pain Management:    Induction: Intravenous  Airway Management Planned: Nasal Cannula  Additional Equipment: None  Intra-op Plan:   Post-operative Plan:   Informed Consent: I have reviewed the patients History and Physical, chart, labs and discussed the procedure including the risks, benefits and alternatives for the proposed anesthesia with the patient or authorized representative who has indicated his/her understanding and acceptance.     Plan Discussed with: CRNA and Surgeon  Anesthesia Plan Comments:         Anesthesia Quick Evaluation

## 2014-09-05 NOTE — Progress Notes (Signed)
Patient due for third unit of blood but order not placed for blood bank to have unit ready. NP Baltazar Najjar paged. Order for stat CBC at this time

## 2014-09-06 DIAGNOSIS — K279 Peptic ulcer, site unspecified, unspecified as acute or chronic, without hemorrhage or perforation: Secondary | ICD-10-CM

## 2014-09-06 DIAGNOSIS — I10 Essential (primary) hypertension: Secondary | ICD-10-CM

## 2014-09-06 LAB — BASIC METABOLIC PANEL
ANION GAP: 7 (ref 5–15)
BUN: 8 mg/dL (ref 6–20)
CO2: 24 mmol/L (ref 22–32)
CREATININE: 0.89 mg/dL (ref 0.44–1.00)
Calcium: 8.3 mg/dL — ABNORMAL LOW (ref 8.9–10.3)
Chloride: 108 mmol/L (ref 101–111)
GFR calc Af Amer: >60 mL/min (ref >60)
Glucose, Bld: 87 mg/dL (ref 65–99)
POTASSIUM: 3.8 mmol/L (ref 3.5–5.1)
Sodium: 139 mmol/L (ref 135–145)

## 2014-09-06 LAB — TYPE AND SCREEN
ABO/RH(D): A POS
ANTIBODY SCREEN: NEGATIVE
UNIT DIVISION: 0
UNIT DIVISION: 0
Unit division: 0

## 2014-09-06 MED ORDER — PANTOPRAZOLE SODIUM 40 MG PO TBEC: 40.0000 mg | DELAYED_RELEASE_TABLET | Freq: Two times a day (BID) | ORAL | Status: DC

## 2014-09-06 NOTE — Discharge Summary (Signed)
Physician Discharge Summary  Tammy Kirby HBZ:[id number] DOB: 16-Dec-1950 DOA: 09/04/2014  PCP: No primary care provider on file.  Admit date: 09/04/2014 Discharge date: 09/06/2014  Time spent: 45 minutes  Recommendations for Outpatient Follow-up:  1. Dr.Chen [facility] for Descending Thoracic aneurysm 2. Dr.Buccini in 1 month, FU path and due for colonoscopy  Discharge Diagnoses:  Principal Problem:   UGI bleed   Duodenal ulcer   HYPERTENSION, BENIGN ESSENTIAL   DISC DISEASE, LUMBAR   Acute blood loss anemia   Thoracic aortic aneurysm   Peptic ulcer disease   Discharge Condition: stable  Diet recommendation: low sodium  Filed Weights   09/04/14 1609  Weight: 85.73 kg (189 lb)    History of present illness:  CHIEF COMPLAINT:  Epigastric pain worse for the past 2-3 days-ongoing for the past 3 weeks Melanotic stools 7 days Lightheadedness/dizziness/generalized weakness-2-3 days  HPI: Tammy Kirby is a 64 y.o. female with a Past Medical History of hypertension, prior CT evidence of a thoracic aneurysm, chronic back pain on narcotics and NSAIDs who presents today with the above noted complaint. Per patient, for the past 3 weeks she had been having epigastric pain. This pain has acutely worsened in the past 2-3 days. This was associated with black colored loose stools. Occasionally stools were maroonish in color. Over the past 2-3 days she's developed generalized weakness, dizziness upon standing up and ambulation. She feels very fatigued and some mild exertional dyspnea on walking. She was seen in the emergency room on 6/26 with similar complaints, however her hemoglobin then was 11.6, she was also evaluated with a CT scan of the abdomen with contrast which showed mild inflammation around the pylorus/duodenal bulb suspicious for peptic ulcer disease, she was also seen to have a descending thoracic aortic aneurysm measuring 3.8 cm which was essentially unchanged from CT scan done  on 08/16/2011. She was subsequently sent home to follow-up with the PCP and a vascular surgeon  Hospital Course:  UGI bleed/Melena -EGD with large duodenal ulcer noted, treated with PPI, advised to avoid NSAIDs -given 3 units PRBC this admission -this am, stable improved and tolerating diet -await path  . Acute blood loss anemia:  -due to above -transfused 3 units this admission.   . Thoracic aortic aneurysm:  - Per CT report-this is essentially unchanged from prior MRI Abd 6/13 - i made her a FU with [facility] Dr.Chen; needs FU and surveillance of this  . HYPERTENSION, BENIGN ESSENTIAL:  -stable  . Chronic back pain/chronic pain syndrome:  -continue home narcotic regimen   .2.3 cm right adrenal mass: -unchanged from 08/12/2011 and which has been characterized as a benign adenoma on MRI.   Procedures: EGD: ENDOSCOPIC IMPRESSION: 1. No active bleeding or blood in the stomach at the time of this procedure  2. Large ulcer in proximal second duodenum  Consultations:  GI-Buccini  Discharge Exam: Filed Vitals:   09/06/14 0526  BP: 149/71  Pulse: 78  Temp: 99 F (37.2 C)  Resp: 17    General:AAOx3 Cardiovascular: S!S2/RRR Respiratory: CTAB  Discharge Instructions   Discharge Instructions    Diet - low sodium heart healthy    Complete by:  As directed      Increase activity slowly    Complete by:  As directed           Discharge Medication List as of 09/06/2014 10:52 AM    START taking these medications   Details  pantoprazole (PROTONIX) 40 MG tablet Take 1 tablet (  40 mg total) by mouth 2 (two) times daily., Starting 09/06/2014, Until Discontinued, Print      CONTINUE these medications which have NOT CHANGED   Details  amLODipine (NORVASC) 10 MG tablet Take 10 mg by mouth daily., Until Discontinued, Historical Med    CINNAMON PO Take 1 capsule by mouth daily., Until Discontinued, Historical Med    dicyclomine (BENTYL) 20 MG tablet Take 1 tablet (20 mg  total) by mouth 2 (two) times daily., Starting 09/01/2014, Until Discontinued, Print    Flaxseed, Linseed, (FLAXSEED OIL PO) Take 1 tablet by mouth daily., Until Discontinued, Historical Med    HYDROcodone-acetaminophen (NORCO) 10-325 MG per tablet Take 1 tablet by mouth every 8 (eight) hours as needed. For pain, Until Discontinued, Historical Med    loperamide (IMODIUM) 2 MG capsule Take 1 capsule (2 mg total) by mouth 4 (four) times daily as needed for diarrhea or loose stools., Starting 09/01/2014, Until Discontinued, Print    Multiple Vitamin (MULTIVITAMIN WITH MINERALS) TABS Take 1 tablet by mouth daily., Until Discontinued, Historical Med    ondansetron (ZOFRAN ODT) 4 MG disintegrating tablet Take 1 tablet (4 mg total) by mouth every 8 (eight) hours as needed for nausea or vomiting., Starting 09/01/2014, Until Discontinued, Print      STOP taking these medications     naproxen (NAPROSYN) 500 MG tablet        Allergies  Allergen Reactions  . Penicillins Other (See Comments)    Reaction unknown  . Sulfonamide Derivatives Other (See Comments)    Reaction unknown   Follow-up Information    Follow up with Adele Barthel, MD On 09/24/2014.   Specialties:  Vascular Surgery, Cardiology   Why:  11 am   Contact information:   [address] [place] [address] [phone]       Follow up with Cleotis Nipper, MD. Schedule an appointment as soon as possible for a visit in 1 month.   Specialty:  Gastroenterology   Contact information:   [address] [place] [address] [phone]        The results of significant diagnostics from this hospitalization (including imaging, microbiology, ancillary and laboratory) are listed below for reference.    Significant Diagnostic Studies: Ct Abdomen Pelvis W Contrast  09/01/2014   CLINICAL DATA:  Abdominal pain, diarrhea and nausea. 3-4 days duration.  EXAM: CT ABDOMEN AND PELVIS WITH CONTRAST  TECHNIQUE: Multidetector CT  imaging of the abdomen and pelvis was performed using the standard protocol following bolus administration of intravenous contrast.  CONTRAST:  159mL OMNIPAQUE IOHEXOL 300 MG/ML  SOLN  COMPARISON:  08/12/2011, 11/15/2007.  FINDINGS: There is mild inflammatory stranding around the gastric pylorus and duodenal bulb. This could represent peptic ulcer disease. There is no extraluminal air. Remainder of the small bowel appears normal. The appendix is normal. The colon is remarkable only for mild uncomplicated diverticulosis.  There is prior cholecystectomy. The bile ducts appear normal. The liver appears normal. The spleen, pancreas and kidneys appear unremarkable. There is a 2.3 cm right adrenal mass which is unchanged from 08/12/2011 and which has been characterized as a benign adenoma on MRI. Left adrenal is normal.  There is a 12 mm right lateral uterine mass which likely represents a benign fibroid. There are otherwise unremarkable appearances of the uterus and ovaries.  There is moderate aneurysm enlargement of the distal thoracic aorta with maximum AP diameter of 3.8 cm. The aorta tapers to 3.4 cm at the celiac origin and  2.8 cm at the renal artery origins. It remains normal in caliber throughout the remainder of its length with moderate atherosclerotic calcification. In the lower thoracic aorta and upper abdominal aorta there is prominent mural thrombus or chronic dissection and this appears to be unchanged from the MRI of 08/12/2011.  No other significant abnormality is evident in the lower chest.  No significant skeletal lesions are evident. There is severe degenerative disc disease and moderate facet arthritis in the lumbar spine, with grade 1 degenerative-appearing anterolisthesis at L4-5.  IMPRESSION: 1. Mild inflammation around the pylorus and duodenal bulb, suspicious for peptic ulcer disease. No extraluminal air. 2. 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 3. Unchanged benign right adrenal adenoma. 4. Probable 12 mm right lateral uterine fibroid. 5. Severe lumbar degenerative disc and facet disease.   Electronically Signed   By: Andreas Newport M.D.   On: 09/01/2014 02:23    Microbiology: No results found for this or any previous visit (from the past 240 hour(s)).   Labs: Basic Metabolic Panel:  Recent Labs Lab 08/31/14 2239 09/04/14 1627 09/05/14 0441 09/06/14 0551  NA 141 133* 137 139  K 3.6 3.3* 3.6 3.8  CL 108 101 104 108  CO2 23 23 22 24   GLUCOSE 131* 115* 91 87  BUN 37* 6 7 8   CREATININE 1.04* 0.79 0.88 0.89  CALCIUM 8.7* 8.1* 7.7* 8.3*   Liver Function Tests:  Recent Labs Lab 08/31/14 2239 09/04/14 1627  AST 15 17  ALT 15 14  ALKPHOS 74 62  BILITOT 0.5 0.4  PROT 6.6 5.4*  ALBUMIN 3.0* 2.5*    Recent Labs Lab 08/31/14 2239  LIPASE 15*   No results for input(s): AMMONIA in the last 168 hours. CBC:  Recent Labs Lab 08/31/14 2239 09/04/14 1627 09/04/14 2108 09/05/14 0643 09/05/14 1540  WBC 12.5* 12.2* 14.1* 10.4 9.1  NEUTROABS 9.3* 9.9*  --   --   --   HGB 11.6* 6.4* 6.8* 7.8* 8.6*  HCT 34.9* 19.3* 20.5* 23.4* 25.8*  MCV 89.9 90.2 90.7 87.3 87.2  PLT 371 329 328 247 256   Cardiac Enzymes: No results for input(s): CKTOTAL, CKMB, CKMBINDEX, TROPONINI in the last 168 hours. BNP: BNP (last 3 results) No results for input(s): BNP in the last 8760 hours.  ProBNP (last 3 results) No results for input(s): PROBNP in the last 8760 hours.  CBG:  Recent Labs Lab 09/05/14 0803 09/05/14 1201 09/05/14 1646  GLUCAP 83 87 115*       Signed:  Cherisa Brucker  [facility] 09/06/2014, 1:30 PM

## 2014-09-06 NOTE — Progress Notes (Signed)
NURSING PROGRESS NOTE  VICTOR GRANADOS [id number] Discharge Data: 09/06/2014 11:20 AM Attending Provider: Domenic Polite, MD PCP:No primary care provider on file.   Melina Schools to be dc'd to home per MD order with family, patient taken out by NA Dustin. Patient verbalized understanding that she will call number provided on d/c paperwork to book her GI follow up appointment.  Patient verbalizes understanding that she needs to call and make her GI follow up appointment for outpatient follow up.  All IV's will be discontinued and monitored for bleeding.  All belongings will be returned to patient for patient to take home.  Last Documented Vital Signs:  Blood pressure 149/71, pulse 78, temperature 99 F (37.2 C), temperature source Oral, resp. rate 17, height 5\' 1"  (1.549 m), weight 85.73 kg (189 lb), SpO2 94 %.  Hendricks Limes RN, BS, BSN

## 2014-09-09 ENCOUNTER — Encounter (HOSPITAL_COMMUNITY): Payer: Self-pay | Admitting: Gastroenterology

## 2014-09-18 DIAGNOSIS — Z1211 Encounter for screening for malignant neoplasm of colon: Secondary | ICD-10-CM | POA: Diagnosis not present

## 2014-09-18 DIAGNOSIS — D62 Acute posthemorrhagic anemia: Secondary | ICD-10-CM | POA: Diagnosis not present

## 2014-09-18 DIAGNOSIS — K264 Chronic or unspecified duodenal ulcer with hemorrhage: Secondary | ICD-10-CM | POA: Diagnosis not present

## 2014-09-22 ENCOUNTER — Encounter: Payer: Self-pay | Admitting: Vascular Surgery

## 2014-09-24 ENCOUNTER — Ambulatory Visit (INDEPENDENT_AMBULATORY_CARE_PROVIDER_SITE_OTHER): Payer: Medicare Other | Admitting: Vascular Surgery

## 2014-09-24 ENCOUNTER — Encounter: Payer: Self-pay | Admitting: Vascular Surgery

## 2014-09-24 ENCOUNTER — Other Ambulatory Visit: Payer: Self-pay

## 2014-09-24 VITALS — BP 180/99 | HR 92 | Temp 99.1°F | Resp 20 | Ht 61.0 in | Wt 187.0 lb

## 2014-09-24 DIAGNOSIS — R1010 Upper abdominal pain, unspecified: Secondary | ICD-10-CM

## 2014-09-24 DIAGNOSIS — M7989 Other specified soft tissue disorders: Secondary | ICD-10-CM

## 2014-09-24 DIAGNOSIS — I712 Thoracic aortic aneurysm, without rupture, unspecified: Secondary | ICD-10-CM

## 2014-09-24 DIAGNOSIS — R0789 Other chest pain: Secondary | ICD-10-CM

## 2014-09-24 NOTE — Addendum Note (Signed)
Addended by: Mena Goes on: 09/24/2014 03:12 PM   Modules accepted: Orders

## 2014-09-24 NOTE — Progress Notes (Signed)
Referred by: Nolene Ebbs, MD [address] [place], [place] [address]  Reason for referral: thoracic aortic aneurysm  History of Present Illness  The patient is a 64 y.o. (June 03, 1950) female who presents with chief complaint: previous GI bleed.  Patient recently in the hospital at end of June 2016 with melanotic stools and work-up which diagnosed duodenal ulcer.  She had a CT abd/pelvis which suggested a possible thrombosed aortic dissection with associate aneurysm of thoracic aorta.  No thoracic CT was was ordered at that time.  The patient does not have back or abdominal pain currently.  She has never had sx consistent with acute aortic dissection.  The patient quit smoking cigarettes ~1 month ago.  There is no family history of aneurysmal disease.   Past Medical History  Diagnosis Date  . Hypertension   . Hypercholesteremia     Past Surgical History  Procedure Laterality Date  . Cholecystectomy    . Esophagogastroduodenoscopy (egd) with propofol N/A 09/05/2014    Procedure: ESOPHAGOGASTRODUODENOSCOPY (EGD) WITH PROPOFOL;  Surgeon: Ronald Lobo, MD;  Location: [place] [facility];  Service: Endoscopy;  Laterality: N/A;    History   Social History  . Marital Status: Single    Spouse Name: N/A  . Number of Children: N/A  . Years of Education: N/A   Occupational History  . Not on file.   Social History Main Topics  . Smoking status: Former Smoker    Quit date: 05/25/2014  . Smokeless tobacco: Not on file  . Alcohol Use: No  . Drug Use: No  . Sexual Activity: Not on file   Other Topics Concern  . Not on file   Social History Narrative    History reviewed. No pertinent family history.  Current Outpatient Prescriptions  Medication Sig Dispense Refill  . amLODipine (NORVASC) 10 MG tablet Take 10 mg by mouth daily.    . baclofen (LIORESAL) 10 MG tablet Take 10 mg by mouth 2 (two) times daily.    . calcium-vitamin D (OSCAL WITH D) 500-200 MG-UNIT per tablet Take 1  tablet by mouth.    . co-enzyme Q-10 30 MG capsule Take 100 mg by mouth daily.    . Flaxseed, Linseed, (FLAXSEED OIL PO) Take 1 tablet by mouth daily.    Marland Kitchen gabapentin (NEURONTIN) 300 MG capsule Take 300 mg by mouth 2 (two) times daily.    Marland Kitchen HYDROcodone-acetaminophen (NORCO/VICODIN) 5-325 MG per tablet Take 1 tablet by mouth every 6 (six) hours as needed for moderate pain.    . Multiple Vitamin (MULTIVITAMIN WITH MINERALS) TABS Take 1 tablet by mouth daily.    . naproxen (NAPROSYN) 500 MG tablet Take 500 mg by mouth 2 (two) times daily with a meal.    . ondansetron (ZOFRAN ODT) 4 MG disintegrating tablet Take 1 tablet (4 mg total) by mouth every 8 (eight) hours as needed for nausea or vomiting. 10 tablet 0  . pantoprazole (PROTONIX) 40 MG tablet Take 1 tablet (40 mg total) by mouth 2 (two) times daily. 60 tablet 0  . pravastatin (PRAVACHOL) 80 MG tablet Take 80 mg by mouth daily.    . Selenium 200 MCG CAPS Take 200 mcg by mouth daily.    . Tapentadol HCl (NUCYNTA ER) 150 MG TB12 Take 150 mg by mouth 2 (two) times daily.    Marland Kitchen amLODipine-benazepril (LOTREL) 10-40 MG per capsule     . CINNAMON PO Take 1 capsule by mouth daily.    Marland Kitchen dicyclomine (BENTYL) 20 MG  tablet Take 1 tablet (20 mg total) by mouth 2 (two) times daily. (Patient not taking: Reported on 09/24/2014) 10 tablet 0  . ibandronate (BONIVA) 150 MG tablet     . loperamide (IMODIUM) 2 MG capsule Take 1 capsule (2 mg total) by mouth 4 (four) times daily as needed for diarrhea or loose stools. (Patient not taking: Reported on 09/24/2014) 12 capsule 0   No current facility-administered medications for this visit.     Allergies  Allergen Reactions  . Penicillins Other (See Comments)    Reaction unknown  . Sulfonamide Derivatives Other (See Comments)    Reaction unknown    REVIEW OF SYSTEMS:  (Positives checked otherwise negative)  CARDIOVASCULAR:   [ ]  chest pain,  [ ]  chest pressure,  [ ]  palpitations,  [ ]  shortness of breath when  laying flat,  [ ]  shortness of breath with exertion,   [x]  pain in feet when walking,  [ ]  pain in feet when laying flat, [ ]  history of blood clot in veins (DVT),  [ ]  history of phlebitis,  [x]  swelling in legs,  [ ]  varicose veins  PULMONARY:   [ ]  productive cough,  [ ]  asthma,  [ ]  wheezing  NEUROLOGIC:   [ ]  weakness in arms or legs,  [ ]  numbness in arms or legs,  [ ]  difficulty speaking or slurred speech,  [ ]  temporary loss of vision in one eye,  [ ]  dizziness, [x]  neuropathic pain in L leg associated with HNP  HEMATOLOGIC:   [ ]  bleeding problems,  [ ]  problems with blood clotting too easily  MUSCULOSKEL:   [ ]  joint pain, [ ]  joint swelling  GASTROINTEST:   [ ]  vomiting blood,  [x]  blood in stool     GENITOURINARY:   [ ]  burning with urination,  [ ]  blood in urine  PSYCHIATRIC:   [ ]  history of major depression  INTEGUMENTARY:   [ ]  rashes,  [ ]  ulcers  CONSTITUTIONAL:   [ ]  fever,  [ ]  chills   For VQI Use Only  PRE-ADM LIVING: Home  AMB STATUS: Ambulatory  CAD Sx: None  PRIOR CHF: None  STRESS TEST: [x]  No, [ ]  Normal, [ ]  + ischemia, [ ]  + MI, [ ]  Both   Physical Examination  Filed Vitals:   09/24/14 1135  BP: 180/99  Pulse: 92  Temp: 99.1 F (37.3 C)  TempSrc: Oral  Resp: 20  Height: 5\' 1"  (1.549 m)  Weight: 187 lb (84.823 kg)  SpO2: 96%   Body mass index is 35.35 kg/(m^2).  General: A&O x 3, WDWN  Head: [place]/AT  Ear/Nose/Throat: Hearing grossly intact, nares w/o erythema or drainage, oropharynx w/ Erythema w/o Exudate  Eyes: PERRLA, EOMI  Neck: Supple, no nuchal rigidity, no palpable LAD, redunant anterior skin  Pulmonary: Sym exp, good air movt, CTAB, no rales, rhonchi, & wheezing  Cardiac: RRR, Nl S1, S2, no Murmurs, rubs or gallops  Vascular: Vessel Right Left  Radial Palpable Palpable  Brachial Palpable Palpable  Carotid Palpable, without bruit Palpable, without bruit  Aorta Not palpable N/A  Femoral  Palpable Palpable  Popliteal Not palpable Not palpable  PT Faintly Palpable Faintly Palpable  DP Faintly Palpable Faintly Palpable   Gastrointestinal: soft, NTND, no G/R, no HSM, no masses, no CVAT B, no palpable AAA  Musculoskeletal: M/S 5/5 throughout , Extremities without ischemic changes , B faint varicosities and spider veins, B cyanotic feet  Neurologic: CN 2-12  intact , Pain and light touch intact in extremities , Motor exam as listed above  Psychiatric: Judgment intact, Mood & affect appropriate for pt's clinical situation  Dermatologic: See M/S exam for extremity exam, no rashes otherwise noted  Lymph : No Cervical, Axillary, or Inguinal lymphadenopathy   CT Abd/pelvis (09/01/14) 1. Mild inflammation around the pylorus and duodenal bulb, suspicious for peptic ulcer disease. No extraluminal air. 2. 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 3. Unchanged benign right adrenal adenoma. 4. Probable 12 mm right lateral uterine fibroid. 5. Severe lumbar degenerative disc and facet disease.  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  None of the mesenteric or renal arteries appeared to be involved.  Dissection does not appear to extend to iliac arteries.   Outside Studies/Documentation 4 pages of outside documents were reviewed including: PCP chart.   Medical Decision Making  The patient is a 64 y.o. 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  The natural history of aortic dissection includes aneurysmal degeneration so long-term follow up will be needed.  I would also obtain BLE Venous reflux duplex to evaluate both legs for CVI.  I emphasized the importance of maximal medical management including strict control of blood pressure, blood glucose, and lipid levels, antiplatelet agents, obtaining regular exercise, and cessation of smoking.    The patient will follow up in 2-4 weeks with these studies.  Thank you for allowing [place] to participate in this patient's care.   Adele Barthel, MD [facility] Office: [phone] Pager: [phone]  09/24/2014, 12:26 PM

## 2014-09-25 DIAGNOSIS — Z79899 Other long term (current) drug therapy: Secondary | ICD-10-CM | POA: Diagnosis not present

## 2014-09-25 DIAGNOSIS — M5137 Other intervertebral disc degeneration, lumbosacral region: Secondary | ICD-10-CM | POA: Diagnosis not present

## 2014-09-25 DIAGNOSIS — M353 Polymyalgia rheumatica: Secondary | ICD-10-CM | POA: Diagnosis not present

## 2014-09-25 DIAGNOSIS — G894 Chronic pain syndrome: Secondary | ICD-10-CM | POA: Diagnosis not present

## 2014-09-25 DIAGNOSIS — M1990 Unspecified osteoarthritis, unspecified site: Secondary | ICD-10-CM | POA: Diagnosis not present

## 2014-10-17 DIAGNOSIS — E784 Other hyperlipidemia: Secondary | ICD-10-CM | POA: Diagnosis not present

## 2014-10-17 DIAGNOSIS — E669 Obesity, unspecified: Secondary | ICD-10-CM | POA: Diagnosis not present

## 2014-10-17 DIAGNOSIS — I10 Essential (primary) hypertension: Secondary | ICD-10-CM | POA: Diagnosis not present

## 2014-10-17 DIAGNOSIS — F17200 Nicotine dependence, unspecified, uncomplicated: Secondary | ICD-10-CM | POA: Diagnosis not present

## 2014-10-17 DIAGNOSIS — M4306 Spondylolysis, lumbar region: Secondary | ICD-10-CM | POA: Diagnosis not present

## 2014-10-17 DIAGNOSIS — K2901 Acute gastritis with bleeding: Secondary | ICD-10-CM | POA: Diagnosis not present

## 2014-10-20 ENCOUNTER — Other Ambulatory Visit: Payer: Self-pay | Admitting: Vascular Surgery

## 2014-10-20 ENCOUNTER — Ambulatory Visit (HOSPITAL_COMMUNITY)
Admission: RE | Admit: 2014-10-20 | Discharge: 2014-10-20 | Disposition: A | Discharge status: Home / Self Care | Payer: Medicare Other | Source: Ambulatory Visit | Attending: Vascular Surgery | Admitting: Vascular Surgery

## 2014-10-20 DIAGNOSIS — I8391 Asymptomatic varicose veins of right lower extremity: Secondary | ICD-10-CM | POA: Insufficient documentation

## 2014-10-20 DIAGNOSIS — R609 Edema, unspecified: Secondary | ICD-10-CM

## 2014-10-23 ENCOUNTER — Other Ambulatory Visit: Payer: Self-pay | Admitting: *Deleted

## 2014-10-23 ENCOUNTER — Encounter: Payer: Self-pay | Admitting: Vascular Surgery

## 2014-10-23 DIAGNOSIS — Z01812 Encounter for preprocedural laboratory examination: Secondary | ICD-10-CM

## 2014-10-23 DIAGNOSIS — M353 Polymyalgia rheumatica: Secondary | ICD-10-CM | POA: Diagnosis not present

## 2014-10-23 DIAGNOSIS — M797 Fibromyalgia: Secondary | ICD-10-CM | POA: Diagnosis not present

## 2014-10-23 DIAGNOSIS — M5137 Other intervertebral disc degeneration, lumbosacral region: Secondary | ICD-10-CM | POA: Diagnosis not present

## 2014-10-23 DIAGNOSIS — G894 Chronic pain syndrome: Secondary | ICD-10-CM | POA: Diagnosis not present

## 2014-10-23 DIAGNOSIS — Z79899 Other long term (current) drug therapy: Secondary | ICD-10-CM | POA: Diagnosis not present

## 2014-10-23 DIAGNOSIS — M1990 Unspecified osteoarthritis, unspecified site: Secondary | ICD-10-CM | POA: Diagnosis not present

## 2014-10-23 LAB — CREATININE, SERUM: Creat: 1.11 mg/dL — ABNORMAL HIGH (ref 0.50–0.99)

## 2014-10-24 ENCOUNTER — Encounter: Payer: Self-pay | Admitting: Vascular Surgery

## 2014-10-24 ENCOUNTER — Ambulatory Visit
Admission: RE | Admit: 2014-10-24 | Discharge: 2014-10-24 | Disposition: A | Discharge status: Home / Self Care | Payer: Medicare Other | Source: Ambulatory Visit | Attending: Vascular Surgery | Admitting: Vascular Surgery

## 2014-10-24 ENCOUNTER — Ambulatory Visit (INDEPENDENT_AMBULATORY_CARE_PROVIDER_SITE_OTHER): Payer: Medicare Other | Admitting: Vascular Surgery

## 2014-10-24 VITALS — BP 157/84 | HR 94 | Temp 98.6°F | Resp 18 | Ht 62.0 in | Wt 179.8 lb

## 2014-10-24 DIAGNOSIS — I712 Thoracic aortic aneurysm, without rupture, unspecified: Secondary | ICD-10-CM

## 2014-10-24 DIAGNOSIS — I7121 Aneurysm of the ascending aorta, without rupture: Secondary | ICD-10-CM | POA: Insufficient documentation

## 2014-10-24 DIAGNOSIS — R0789 Other chest pain: Secondary | ICD-10-CM

## 2014-10-24 DIAGNOSIS — R1010 Upper abdominal pain, unspecified: Secondary | ICD-10-CM

## 2014-10-24 DIAGNOSIS — R0689 Other abnormalities of breathing: Secondary | ICD-10-CM | POA: Diagnosis not present

## 2014-10-24 MED ORDER — IOPAMIDOL (ISOVUE-370) INJECTION 76%
75.0000 mL | Freq: Once | INTRAVENOUS | Status: AC | PRN
  Administered 2014-10-24: 75 mL via INTRAVENOUS

## 2014-10-24 NOTE — Progress Notes (Signed)
Established Aortic Aneurysm   History of Present Illness  The patient is a 64 y.o. (June 23, 1950) female who presents with chief complaint: follow up from chest CTA.  This patient previous had a CT abd/pelvis suggestive of possible thrombosed thoracic aortic dissection.  She denies any chest of back pain.  She denies any cardiac sx.  The patient is not an active smoker but previously did smoke.  The patient's PMH, PSH, SH, and FamHx are unchanged from 09/24/14.  Current Outpatient Prescriptions  Medication Sig Dispense Refill  . amLODipine (NORVASC) 10 MG tablet Take 10 mg by mouth daily.    Marland Kitchen amLODipine-benazepril (LOTREL) 10-40 MG per capsule     . calcium-vitamin D (OSCAL WITH D) 500-200 MG-UNIT per tablet Take 1 tablet by mouth.    . co-enzyme Q-10 30 MG capsule Take 100 mg by mouth daily.    . Flaxseed, Linseed, (FLAXSEED OIL PO) Take 1 tablet by mouth daily.    Marland Kitchen HYDROcodone-acetaminophen (NORCO/VICODIN) 5-325 MG per tablet Take 1 tablet by mouth every 6 (six) hours as needed for moderate pain. Currently Taking 10-325 tablets.    . ibandronate (BONIVA) 150 MG tablet     . Multiple Vitamin (MULTIVITAMIN WITH MINERALS) TABS Take 1 tablet by mouth daily.    . pantoprazole (PROTONIX) 40 MG tablet Take 1 tablet (40 mg total) by mouth 2 (two) times daily. 60 tablet 0  . pravastatin (PRAVACHOL) 80 MG tablet Take 80 mg by mouth daily.    . Selenium 200 MCG CAPS Take 200 mcg by mouth daily.    . Tapentadol HCl (NUCYNTA ER) 150 MG TB12 Take 150 mg by mouth 2 (two) times daily.    . baclofen (LIORESAL) 10 MG tablet Take 10 mg by mouth 2 (two) times daily.    Marland Kitchen CINNAMON PO Take 1 capsule by mouth daily.    Marland Kitchen dicyclomine (BENTYL) 20 MG tablet Take 1 tablet (20 mg total) by mouth 2 (two) times daily. (Patient not taking: Reported on 09/24/2014) 10 tablet 0  . gabapentin (NEURONTIN) 300 MG capsule Take 300 mg by mouth 2 (two) times daily.    Marland Kitchen HYDROcodone-acetaminophen (NORCO) 10-325 MG per tablet      . loperamide (IMODIUM) 2 MG capsule Take 1 capsule (2 mg total) by mouth 4 (four) times daily as needed for diarrhea or loose stools. (Patient not taking: Reported on 09/24/2014) 12 capsule 0  . naproxen (NAPROSYN) 500 MG tablet Take 500 mg by mouth 2 (two) times daily with a meal.    . ondansetron (ZOFRAN ODT) 4 MG disintegrating tablet Take 1 tablet (4 mg total) by mouth every 8 (eight) hours as needed for nausea or vomiting. (Patient not taking: Reported on 10/24/2014) 10 tablet 0   No current facility-administered medications for this visit.    Allergies  Allergen Reactions  . Penicillins Other (See Comments)    Reaction unknown  . Sulfonamide Derivatives Other (See Comments)    Reaction unknown    On ROS today: no chest or back pain, no cardiac sx   Physical Examination  Filed Vitals:   10/24/14 1249 10/24/14 1252  BP: 159/82 157/84  Pulse: 94   Temp: 98.6 F (37 C)   TempSrc: Oral   Resp: 18   Height: 5\' 2"  (1.575 m)   Weight: 179 lb 12.8 oz (81.557 kg)   SpO2: 98%    Body mass index is 32.88 kg/(m^2).  General: A&O x 3, WD, mildly Obese,   Pulmonary: Sym  exp, good air movt, CTAB, no rales, rhonchi, & wheezing  Cardiac: RRR, Nl S1, S2, no Murmurs, rubs or gallops  Vascular: Vessel Right Left  Radial Palpable Palpable  Brachial Palpable Palpable  Carotid Palpable, without bruit Palpable, without bruit  Aorta Not palpable N/A  Femoral Palpable Palpable  Popliteal Not palpable Not palpable  PT Not Palpable Not Palpable  DP Faintly Palpable Faintly Palpable   Gastrointestinal: soft, NTND, no G/R, no HSM, no masses, no CVAT B  Musculoskeletal: M/S 5/5 throughout , Extremities without ischemic changes   Neurologic:  Pain and light touch intact in extremities , Motor exam as listed above   Non-Invasive Vascular Imaging  CTA chest (10/24/2014) 1. Fusiform aneurysmal dilatation of the ascending and descending thoracic aorta as detailed above. There is  extensive mural thrombus involving the descending thoracic aorta but no dissection is identified. 2. No mediastinal or hilar mass or adenopathy. 3. No acute pulmonary findings. 4. 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   Based on my review of this patient's chest CTA, she appears to have an ascending aortic aneurysm nearly 5 cm and then a distal thoracic aneurysm.  There appears to be segment of extensive mural thrombosis in the distal thoracic aorta.   Medical Decision Making  The patient is a 64 y.o. female who presents with: asymptomatic ascending aortic aneurysm , asx descending thoracic aortic aneurysm   Based on this patient's exam and diagnostic studies, he needs: CT surgery referral for evaluation of the ascending aortic aneurysm.  In regards to the descending thoracic aortic aneurysm, I don't think immediate intervention is needed.  Anticoagulation could be considered to try to assist lysis of the thrombus noted on the CTA but obviously this would be counterproductive if surgical intervention is needed on the ascending aorta.  I will see the patient again once CT surgery has seen the patient.  Thank you for allowing [place] to participate in this patient's care.   Adele Barthel, MD [facility] Office: [phone] Pager: [phone]  10/24/2014, 5:31 PM

## 2014-10-28 ENCOUNTER — Other Ambulatory Visit: Payer: Self-pay | Admitting: Gastroenterology

## 2014-10-28 DIAGNOSIS — D125 Benign neoplasm of sigmoid colon: Secondary | ICD-10-CM | POA: Diagnosis not present

## 2014-10-28 DIAGNOSIS — K621 Rectal polyp: Secondary | ICD-10-CM | POA: Diagnosis not present

## 2014-10-28 DIAGNOSIS — D122 Benign neoplasm of ascending colon: Secondary | ICD-10-CM | POA: Diagnosis not present

## 2014-10-28 DIAGNOSIS — K635 Polyp of colon: Secondary | ICD-10-CM | POA: Diagnosis not present

## 2014-10-28 DIAGNOSIS — D124 Benign neoplasm of descending colon: Secondary | ICD-10-CM | POA: Diagnosis not present

## 2014-10-28 DIAGNOSIS — D123 Benign neoplasm of transverse colon: Secondary | ICD-10-CM | POA: Diagnosis not present

## 2014-10-28 DIAGNOSIS — D126 Benign neoplasm of colon, unspecified: Secondary | ICD-10-CM | POA: Diagnosis not present

## 2014-10-28 DIAGNOSIS — Z1211 Encounter for screening for malignant neoplasm of colon: Secondary | ICD-10-CM | POA: Diagnosis not present

## 2014-10-28 DIAGNOSIS — K5730 Diverticulosis of large intestine without perforation or abscess without bleeding: Secondary | ICD-10-CM | POA: Diagnosis not present

## 2014-10-28 DIAGNOSIS — K269 Duodenal ulcer, unspecified as acute or chronic, without hemorrhage or perforation: Secondary | ICD-10-CM | POA: Diagnosis not present

## 2014-11-05 ENCOUNTER — Institutional Professional Consult (permissible substitution) (INDEPENDENT_AMBULATORY_CARE_PROVIDER_SITE_OTHER): Payer: Medicare Other | Admitting: Surgery

## 2014-11-05 ENCOUNTER — Encounter: Payer: Self-pay | Admitting: Surgery

## 2014-11-05 VITALS — BP 150/88 | HR 100 | Resp 20 | Ht 62.0 in | Wt 187.0 lb

## 2014-11-05 DIAGNOSIS — I7121 Aneurysm of the ascending aorta, without rupture: Secondary | ICD-10-CM

## 2014-11-05 DIAGNOSIS — I712 Thoracic aortic aneurysm, without rupture: Secondary | ICD-10-CM

## 2014-11-06 ENCOUNTER — Encounter: Payer: Self-pay | Admitting: Surgery

## 2014-11-06 NOTE — Progress Notes (Signed)
Cardiothoracic Surgery Consultation   PCP is Philis Fendt, MD Referring Provider is Conrad [place], MD  Chief Complaint  Patient presents with  . Thoracic Aortic Aneurysm    CTA Chest 10/24/14    HPI:  The patient is a 64 year old woman with hypertension, hypercholesterolemia who was hospitalized in June 2016 with melena and workup revealed a duodenal ulcer without active bleeding. She had a CT of the abdomen and pelvis that suggested a possible thrombosed aortic dissection with associated aneurysm. She was seen by Dr. Bridgett Larsson and had a CTA of the chest that showed fusiform aneurysmal dilatation of the ascending aorta to 5.0 x 4.9 cm. There was no dissection but moderate atherosclerotic disease of the arch. The descending aorta was tortuous and ectatic with a maximum diameter of 4.0 cm with extensive mural thrombus down into the abdomen.   Past Medical History  Diagnosis Date  . Hypertension   . Hypercholesteremia     Past Surgical History  Procedure Laterality Date  . Cholecystectomy    . Esophagogastroduodenoscopy (egd) with propofol N/A 09/05/2014    Procedure: ESOPHAGOGASTRODUODENOSCOPY (EGD) WITH PROPOFOL;  Surgeon: Ronald Lobo, MD;  Location: [facility];  Service: Endoscopy;  Laterality: N/A;    History reviewed. No pertinent family history.  Social History Social History  Substance Use Topics  . Smoking status: Former Smoker    Quit date: 05/25/2014  . Smokeless tobacco: None  . Alcohol Use: No    Current Outpatient Prescriptions  Medication Sig Dispense Refill  . amLODipine (NORVASC) 10 MG tablet Take 10 mg by mouth daily.    Marland Kitchen amLODipine-benazepril (LOTREL) 10-40 MG per capsule     . calcium-vitamin D (OSCAL WITH D) 500-200 MG-UNIT per tablet Take 1 tablet by mouth.    . co-enzyme Q-10 30 MG capsule Take 100 mg by mouth daily.    . Flaxseed, Linseed, (FLAXSEED OIL PO) Take 1 tablet by mouth daily.    Marland Kitchen HYDROcodone-acetaminophen (NORCO) 10-325 MG per tablet      . ibandronate (BONIVA) 150 MG tablet     . Multiple Vitamin (MULTIVITAMIN WITH MINERALS) TABS Take 1 tablet by mouth daily.    . naproxen (NAPROSYN) 500 MG tablet Take 500 mg by mouth 2 (two) times daily with a meal.    . pantoprazole (PROTONIX) 40 MG tablet Take 1 tablet (40 mg total) by mouth 2 (two) times daily. 60 tablet 0  . pravastatin (PRAVACHOL) 80 MG tablet Take 80 mg by mouth daily.    . Selenium 200 MCG CAPS Take 200 mcg by mouth daily.     No current facility-administered medications for this visit.    Allergies  Allergen Reactions  . Penicillins Other (See Comments)    Reaction unknown  . Sulfonamide Derivatives Other (See Comments)    Reaction unknown    Review of Systems  Constitutional: Positive for appetite change and fatigue.  HENT:       Wears dentures  Eyes:       Floaters  Respiratory: Negative.   Cardiovascular: Negative.   Gastrointestinal: Negative.   Endocrine: Negative.   Genitourinary: Negative.   Musculoskeletal: Positive for myalgias, joint swelling and arthralgias.  Skin: Negative.   Allergic/Immunologic: Negative.   Neurological: Negative.   Hematological: Bruises/bleeds easily.  Psychiatric/Behavioral: Negative.     BP 150/88 mmHg  Pulse 100  Resp 20  Ht 5\' 2"  (1.575 m)  Wt 187 lb (84.823 kg)  BMI 34.19 kg/m2  SpO2 95% Physical Exam  Constitutional: She is oriented to person, place, and time.  Obese white female in no distress  HENT:  Head: Normocephalic and atraumatic.  Mouth/Throat: Oropharynx is clear and moist.  Eyes: EOM are normal. Pupils are equal, round, and reactive to light.  Neck: Normal range of motion. Neck supple. No JVD present. No thyromegaly present.  Cardiovascular: Normal rate, regular rhythm and normal heart sounds.   No murmur heard. Pulmonary/Chest: Effort normal and breath sounds normal. No respiratory distress.  Abdominal: Soft. Bowel sounds are normal. She exhibits no distension and no mass. There is  no tenderness.  Musculoskeletal: Normal range of motion. She exhibits no edema.  Lymphadenopathy:    She has no cervical adenopathy.  Neurological: She is alert and oriented to person, place, and time. She has normal strength. No cranial nerve deficit or sensory deficit.  Skin: Skin is warm and dry.  Psychiatric: She has a normal mood and affect.     Diagnostic Tests:  CLINICAL DATA: Epigastric abdominal pain. Evaluate possible aortic dissection.  EXAM: CT ANGIOGRAPHY CHEST WITH CONTRAST  TECHNIQUE: Multidetector CT imaging of the chest was performed using the standard protocol during bolus administration of intravenous contrast. Multiplanar CT image reconstructions and MIPs were obtained to evaluate the vascular anatomy.  CONTRAST: 75 cc Isovue 370  COMPARISON: Abdominal CT scan 09/01/2014  FINDINGS: Chest wall: No breast masses, supraclavicular or axillary adenopathy. The bony thorax is intact.  Mediastinum: The heart is normal in size. No pericardial effusion. No mediastinal or hilar mass or adenopathy. Small scattered lymph nodes are noted. The esophagus is grossly normal.  Fusiform aneurysmal dilatation of the ascending thoracic aorta with maximal measurements of 5.0 by 4.9 cm at the level of the right pulmonary artery. No ascending aortic dissection. Moderate atherosclerotic calcifications at the aortic arch. The branch vessels are patent.  The descending thoracic aorta is tortuous and ectatic. Maximal measurements are 4.0 x 4.0 cm on image number 81. There is extensive mural thrombus but no dissection. This continues down into the abdomen. The major aortic branch vessels are patent.  The pulmonary arteries appear normal. No filling defects to suggest pulmonary embolism.  Prominent accessory hemi azygos vein with retrograde flow of contrast down into the abdomen.  Lungs/ pleura: No acute pulmonary findings. No pleural effusion or pulmonary  nodule. No interstitial lung disease or bronchiectasis.  Upper abdomen: There is a small right adrenal gland nodule which is stable since the prior CT scan. It measures 20 Hounsfield units on the precontrast images. This is most likely a benign lipid poor adenoma. I would recommend a followup noncontrast abdominal CT scan in 4-6 months to document stability.  Review of the MIP images confirms the above findings.  IMPRESSION: 1. Fusiform aneurysmal dilatation of the ascending and descending thoracic aorta as detailed above. There is extensive mural thrombus involving the descending thoracic aorta but no dissection is identified. Recommend semi-annual imaging followup by CTA or MRA and referral to cardiothoracic surgery if not already obtained. This recommendation follows 2010 ACCF/AHA/AATS/ACR/ASA/SCA/SCAI/SIR/STS/SVM Guidelines for the Diagnosis and Management of Patients With Thoracic Aortic Disease. Circulation. 2010; 121: e266-e36 2. No mediastinal or hilar mass or adenopathy. 3. No acute pulmonary findings. 4. 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 Recommend followup noncontrast abdominal CT scan in 4-6 months to document stability.   Electronically Signed  By: Marijo Sanes M.D.  On: 10/24/2014 11:00   Impression:  She has a 4.9 x 5.0 cm fusiform ascending aortic aneurysm with  diffuse ectasia of the descending aorta to 4.0 cm and extensive atherosclerosis and mural thrombus. She has no chest pain or back pain and typically I would wait until this reached 5.5 cm before recommending surgery. It is unknown how long it has been this size. I reviewed the CTA with her and her family and answered their questions.   Plan:  I will see her back in 6 months with a CTA of the chest and if the aneurysm is unchanged I would follow it yearly.   Gaye Pollack, MD [facility] [phone]

## 2014-11-13 DIAGNOSIS — D62 Acute posthemorrhagic anemia: Secondary | ICD-10-CM | POA: Diagnosis not present

## 2014-11-13 DIAGNOSIS — K264 Chronic or unspecified duodenal ulcer with hemorrhage: Secondary | ICD-10-CM | POA: Diagnosis not present

## 2014-11-26 DIAGNOSIS — G894 Chronic pain syndrome: Secondary | ICD-10-CM | POA: Diagnosis not present

## 2014-11-26 DIAGNOSIS — Z79899 Other long term (current) drug therapy: Secondary | ICD-10-CM | POA: Diagnosis not present

## 2014-11-26 DIAGNOSIS — M5137 Other intervertebral disc degeneration, lumbosacral region: Secondary | ICD-10-CM | POA: Diagnosis not present

## 2014-11-26 DIAGNOSIS — M545 Low back pain: Secondary | ICD-10-CM | POA: Diagnosis not present

## 2014-12-18 DIAGNOSIS — D3132 Benign neoplasm of left choroid: Secondary | ICD-10-CM | POA: Diagnosis not present

## 2014-12-25 DIAGNOSIS — M4696 Unspecified inflammatory spondylopathy, lumbar region: Secondary | ICD-10-CM | POA: Diagnosis not present

## 2014-12-25 DIAGNOSIS — Z79899 Other long term (current) drug therapy: Secondary | ICD-10-CM | POA: Diagnosis not present

## 2014-12-25 DIAGNOSIS — M47817 Spondylosis without myelopathy or radiculopathy, lumbosacral region: Secondary | ICD-10-CM | POA: Diagnosis not present

## 2014-12-25 DIAGNOSIS — M5137 Other intervertebral disc degeneration, lumbosacral region: Secondary | ICD-10-CM | POA: Diagnosis not present

## 2014-12-25 DIAGNOSIS — G894 Chronic pain syndrome: Secondary | ICD-10-CM | POA: Diagnosis not present

## 2014-12-25 DIAGNOSIS — E669 Obesity, unspecified: Secondary | ICD-10-CM | POA: Diagnosis not present

## 2015-01-08 DIAGNOSIS — G894 Chronic pain syndrome: Secondary | ICD-10-CM | POA: Diagnosis not present

## 2015-01-08 DIAGNOSIS — Z79899 Other long term (current) drug therapy: Secondary | ICD-10-CM | POA: Diagnosis not present

## 2015-01-08 DIAGNOSIS — M47817 Spondylosis without myelopathy or radiculopathy, lumbosacral region: Secondary | ICD-10-CM | POA: Diagnosis not present

## 2015-01-16 DIAGNOSIS — G894 Chronic pain syndrome: Secondary | ICD-10-CM | POA: Diagnosis not present

## 2015-01-16 DIAGNOSIS — K2901 Acute gastritis with bleeding: Secondary | ICD-10-CM | POA: Diagnosis not present

## 2015-01-16 DIAGNOSIS — E784 Other hyperlipidemia: Secondary | ICD-10-CM | POA: Diagnosis not present

## 2015-01-16 DIAGNOSIS — I10 Essential (primary) hypertension: Secondary | ICD-10-CM | POA: Diagnosis not present

## 2015-01-16 DIAGNOSIS — Z2821 Immunization not carried out because of patient refusal: Secondary | ICD-10-CM | POA: Diagnosis not present

## 2015-01-20 DIAGNOSIS — Z79899 Other long term (current) drug therapy: Secondary | ICD-10-CM | POA: Diagnosis not present

## 2015-01-20 DIAGNOSIS — M1288 Other specific arthropathies, not elsewhere classified, other specified site: Secondary | ICD-10-CM | POA: Diagnosis not present

## 2015-01-20 DIAGNOSIS — M545 Low back pain: Secondary | ICD-10-CM | POA: Diagnosis not present

## 2015-01-20 DIAGNOSIS — M47817 Spondylosis without myelopathy or radiculopathy, lumbosacral region: Secondary | ICD-10-CM | POA: Diagnosis not present

## 2015-01-20 DIAGNOSIS — G894 Chronic pain syndrome: Secondary | ICD-10-CM | POA: Diagnosis not present

## 2015-02-17 DIAGNOSIS — Z79899 Other long term (current) drug therapy: Secondary | ICD-10-CM | POA: Diagnosis not present

## 2015-02-17 DIAGNOSIS — E669 Obesity, unspecified: Secondary | ICD-10-CM | POA: Diagnosis not present

## 2015-02-17 DIAGNOSIS — M47817 Spondylosis without myelopathy or radiculopathy, lumbosacral region: Secondary | ICD-10-CM | POA: Diagnosis not present

## 2015-02-17 DIAGNOSIS — M4696 Unspecified inflammatory spondylopathy, lumbar region: Secondary | ICD-10-CM | POA: Diagnosis not present

## 2015-02-17 DIAGNOSIS — G894 Chronic pain syndrome: Secondary | ICD-10-CM | POA: Diagnosis not present

## 2015-02-17 DIAGNOSIS — M5137 Other intervertebral disc degeneration, lumbosacral region: Secondary | ICD-10-CM | POA: Diagnosis not present

## 2015-04-01 ENCOUNTER — Other Ambulatory Visit: Payer: Self-pay | Admitting: Surgery

## 2015-04-01 DIAGNOSIS — I712 Thoracic aortic aneurysm, without rupture, unspecified: Secondary | ICD-10-CM

## 2015-04-10 ENCOUNTER — Other Ambulatory Visit: Payer: Self-pay | Admitting: Surgery

## 2015-04-10 DIAGNOSIS — I712 Thoracic aortic aneurysm, without rupture, unspecified: Secondary | ICD-10-CM

## 2015-04-29 ENCOUNTER — Encounter: Payer: Self-pay | Admitting: Surgery

## 2015-04-29 ENCOUNTER — Ambulatory Visit (INDEPENDENT_AMBULATORY_CARE_PROVIDER_SITE_OTHER): Payer: Medicare Other | Admitting: Surgery

## 2015-04-29 ENCOUNTER — Ambulatory Visit
Admission: RE | Admit: 2015-04-29 | Discharge: 2015-04-29 | Disposition: A | Discharge status: Home / Self Care | Payer: Medicare Other | Source: Ambulatory Visit | Attending: Surgery | Admitting: Surgery

## 2015-04-29 VITALS — BP 154/86 | HR 95 | Resp 16 | Ht 62.0 in | Wt 187.0 lb

## 2015-04-29 DIAGNOSIS — I712 Thoracic aortic aneurysm, without rupture, unspecified: Secondary | ICD-10-CM

## 2015-04-29 DIAGNOSIS — I7121 Aneurysm of the ascending aorta, without rupture: Secondary | ICD-10-CM

## 2015-04-29 MED ORDER — IOPAMIDOL (ISOVUE-370) INJECTION 76%
75.0000 mL | Freq: Once | INTRAVENOUS | Status: AC | PRN
  Administered 2015-04-29: 75 mL via INTRAVENOUS

## 2015-04-29 NOTE — Progress Notes (Signed)
HPI:  The patient is a 65 year old woman with hypertension, hypercholesterolemia who was hospitalized in June 2016 with melena and workup revealed a duodenal ulcer without active bleeding. She had a CT of the abdomen and pelvis that suggested a possible thrombosed aortic dissection with associated aneurysm. She was seen by Dr. Bridgett Larsson and had a CTA of the chest that showed fusiform aneurysmal dilatation of the ascending aorta to 5.0 x 4.9 cm. There was no dissection but moderate atherosclerotic disease of the arch. The descending aorta was tortuous and ectatic with a maximum diameter of 4.0 cm with extensive mural thrombus down into the abdomen. Since this was still below the usual threshold for surgery of 5.5 cm I felt that we should continue to follow it. Since I last saw her she has continued to abstain from smoking. It has been a year since she quit. She denies any chest or back pain. She has been taking her BP meds but occasionally forgets a dose. Her family says that she eats a lot of ice cream and soft drinks.  Current Outpatient Prescriptions  Medication Sig Dispense Refill  . amLODipine-benazepril (LOTREL) 10-40 MG per capsule     . calcium-vitamin D (OSCAL WITH D) 500-200 MG-UNIT per tablet Take 1 tablet by mouth.    . co-enzyme Q-10 30 MG capsule Take 100 mg by mouth daily.    . Flaxseed, Linseed, (FLAXSEED OIL PO) Take 1 tablet by mouth daily.    Marland Kitchen gabapentin (NEURONTIN) 300 MG capsule Take 300 mg by mouth 3 (three) times daily. One tab at lunch 2 tabs at dinner and 3 tabs at bedtime    . HYDROcodone-acetaminophen (NORCO) 10-325 MG per tablet     . ibandronate (BONIVA) 150 MG tablet     . Multiple Vitamin (MULTIVITAMIN WITH MINERALS) TABS Take 1 tablet by mouth daily.    . pravastatin (PRAVACHOL) 80 MG tablet Take 80 mg by mouth daily.    . Selenium 200 MCG CAPS Take 200 mcg by mouth daily.    . tapentadol (NUCYNTA) 50 MG TABS tablet Take 150 mg by mouth every 12 (twelve) hours.       No current facility-administered medications for this visit.     Physical Exam: BP 154/86 mmHg  Pulse 95  Resp 16  Ht 5\' 2"  (1.575 m)  Wt 187 lb (84.823 kg)  BMI 34.19 kg/m2  SpO2 97% She looks well Cardiovascular: Normal rate, regular rhythm and normal heart sounds.  No murmur heard. Pulmonary/Chest: Effort normal and breath sounds normal. No respiratory distress.   Diagnostic Tests:  CLINICAL DATA: F/u TAA. Occ cough. Nonsmoker x 10 months. No prev surg or hx ca. Prev in pacs.  EXAM: CT ANGIOGRAPHY CHEST WITH CONTRAST  TECHNIQUE: Multidetector CT imaging of the chest was performed using the standard protocol during bolus administration of intravenous contrast. Multiplanar CT image reconstructions and MIPs were obtained to evaluate the vascular anatomy.  CONTRAST: 75 mL Isovue 370 IV  COMPARISON: 10/24/2014  FINDINGS: Vascular: Right arm IV contrast injection. SVC patent. Right atrium and right ventricle are nondilated. Mild dilatation of central pulmonary arteries. Satisfactory opacification of pulmonary arteries noted, and there is no evidence of pulmonary emboli. Patent bilateral pulmonary veins. Mild coronary calcifications. Ascending thoracic aortic aneurysm with maximum diameter is as follows:  3.6 cm sino-tubular junction  5cm mid ascending (previously 5 cm)  4.1 cm distal ascending/ proximal arch  3.2 cm distal arch  3.6 cm proximal descending  4.3 cm mid descending (previously 4.1 cm)  4.1 cm distal descending  3.3 cm suprarenal  No dissection or stenosis. Classic 3 vessel brachiocephalic arterial origin anatomy without proximal stenosis. Scattered calcified plaque in the aortic arch. The descending segment is tortuous. There is some eccentric nonocclusive mural thrombus in the mid and distal descending segments extending into the suprarenal abdominal aorta.  Mediastinum/Lymph Nodes: No masses or pathologically  enlarged lymph nodes identified. No pericardial effusion.  Lungs/Pleura: No pulmonary mass, infiltrate, or effusion.  Upper abdomen: No acute findings. Cholecystectomy clips. 2.3 cm enhancing/hyperdense right adrenal nodule, stable in size.  Musculoskeletal: Degenerative disc disease in the lower thoracic and upper lumbar spine. Scattered epidural gas bubbles presumably extruded from intervertebral disc vacuum phenomenon.  Review of the MIP images confirms the above findings.  IMPRESSION: 1. Stable 5 cm ascending aortic aneurysm without complicating features. 2. Slight increase in 4.3 cm descending thoracic aortic aneurysm (previously 4.1 cm), with stable nonocclusive eccentric intraluminal mural thrombus. 3. Stable 2.3 cm right adrenal nodule, nonspecific.   Electronically Signed  By: Lucrezia Europe M.D.  On: 04/29/2015 11:07  Impression:  There has been no change in the size of her ascending aortic aneurysm at 5 cm. The descending aortic aneurysm is slightly larger at 4.3 cm compared to 4.1 cm on the last study. 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 I think we should continue to follow this aneurysm since it is still unchanged at 5 cm. I discussed the importance of good blood pressure control including taking her medications as prescribed, low sodium diet, exercise, weight reduction, staying away from ice cream, soft drinks and other junk food. She says that she understands. I discussed the small but real risk of aortic dissection at the current aortic diameter estimated at 1-2% per year.   Plan:  I will see her back in one year with a CTA of the chest.   Gaye Pollack, MD [facility] [phone]

## 2015-05-22 ENCOUNTER — Other Ambulatory Visit: Payer: Self-pay

## 2015-05-22 DIAGNOSIS — Z1231 Encounter for screening mammogram for malignant neoplasm of breast: Secondary | ICD-10-CM

## 2015-06-22 ENCOUNTER — Other Ambulatory Visit (HOSPITAL_COMMUNITY): Payer: Self-pay | Admitting: Internal Medicine

## 2015-06-22 ENCOUNTER — Inpatient Hospital Stay: Admission: RE | Admit: 2015-06-22 | Payer: Medicare Other | Source: Ambulatory Visit

## 2015-06-22 ENCOUNTER — Ambulatory Visit: Payer: Medicare Other

## 2015-06-22 DIAGNOSIS — E2839 Other primary ovarian failure: Secondary | ICD-10-CM

## 2015-07-09 ENCOUNTER — Ambulatory Visit
Admission: RE | Admit: 2015-07-09 | Discharge: 2015-07-09 | Disposition: A | Discharge status: Home / Self Care | Payer: Medicare Other | Source: Ambulatory Visit

## 2015-07-09 ENCOUNTER — Ambulatory Visit
Admission: RE | Admit: 2015-07-09 | Discharge: 2015-07-09 | Disposition: A | Discharge status: Home / Self Care | Payer: Medicare Other | Source: Ambulatory Visit | Attending: Internal Medicine | Admitting: Internal Medicine

## 2015-07-09 DIAGNOSIS — E2839 Other primary ovarian failure: Secondary | ICD-10-CM

## 2015-07-09 DIAGNOSIS — Z1231 Encounter for screening mammogram for malignant neoplasm of breast: Secondary | ICD-10-CM

## 2016-04-01 ENCOUNTER — Other Ambulatory Visit: Payer: Self-pay | Admitting: Surgery

## 2016-04-01 DIAGNOSIS — I712 Thoracic aortic aneurysm, without rupture, unspecified: Secondary | ICD-10-CM

## 2016-04-27 ENCOUNTER — Other Ambulatory Visit: Payer: Medicare Other

## 2016-04-27 ENCOUNTER — Ambulatory Visit: Payer: Self-pay | Admitting: Surgery

## 2017-06-20 IMAGING — CT CT ABD-PELV W/ CM
2 of 5 series · 16 of 46 positions shown, 18 images · IV contrast (omnipaque)
Comparison: 08/12/2011, 11/15/2007.

CLINICAL DATA: Abdominal pain, diarrhea and nausea. 3-4 days
duration.

EXAM:
CT ABDOMEN AND PELVIS WITH CONTRAST
TECHNIQUE: Multidetector CT imaging of the abdomen and pelvis was performed
using the standard protocol following bolus administration of
intravenous contrast.
CONTRAST:  100mL OMNIPAQUE IOHEXOL 300 MG/ML  SOLN

[Series 2: abd/ pelvis 5.0 i30f 1 · axial · 0.88mm/px · z∈[+1029,+1424]mm · 13 of 89 slices shown, 15 images]
[im 5/89  soft-tissue]
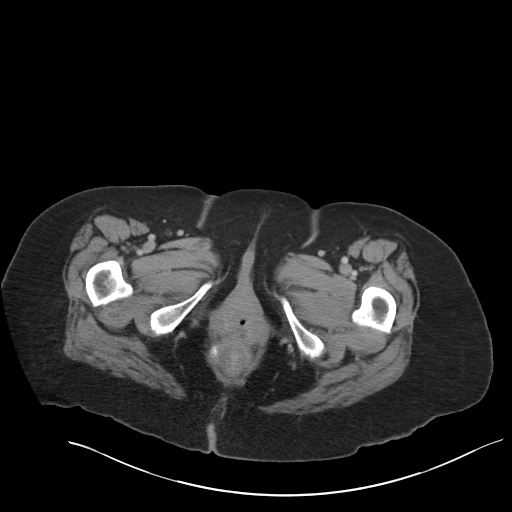
[im 5/89  bone]
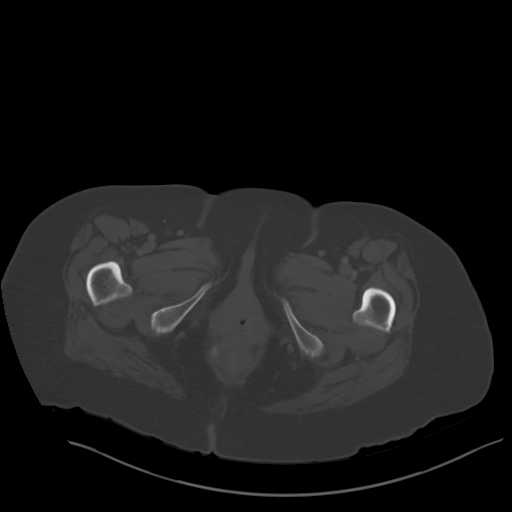
[im 14/89  soft-tissue]
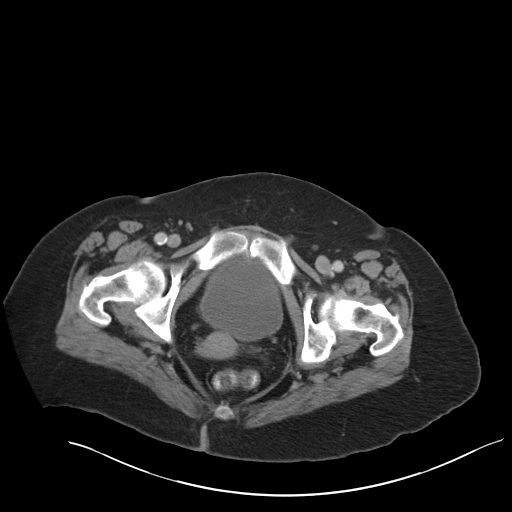
[im 19/89  soft-tissue]
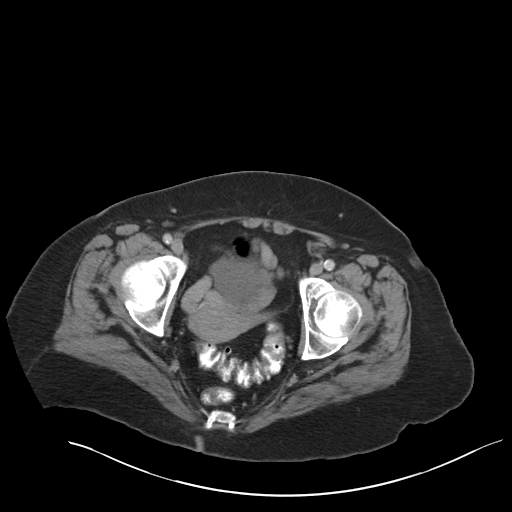
[im 24/89  soft-tissue]
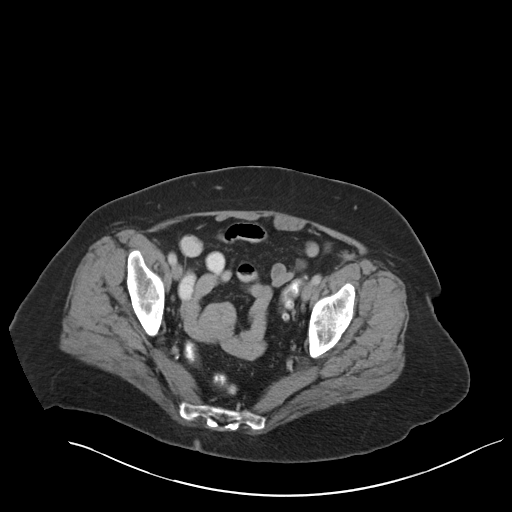
[im 33/89  soft-tissue]
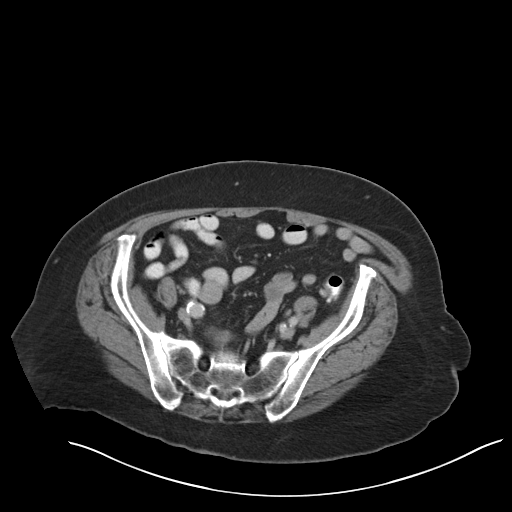
[im 38/89  soft-tissue]
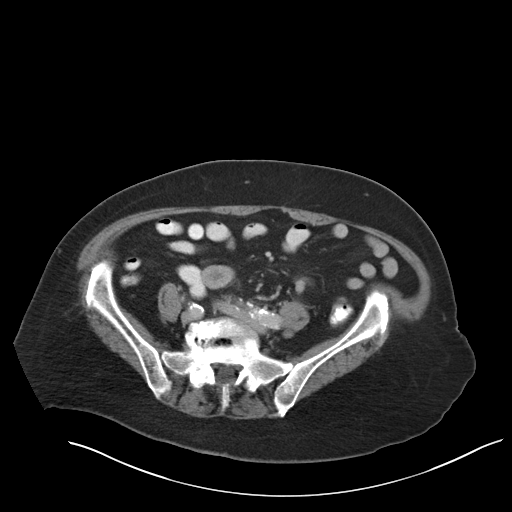
[im 47/89  soft-tissue]
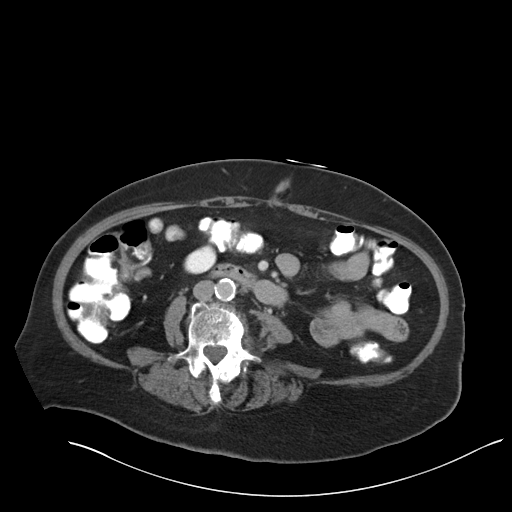
[im 51/89  soft-tissue]
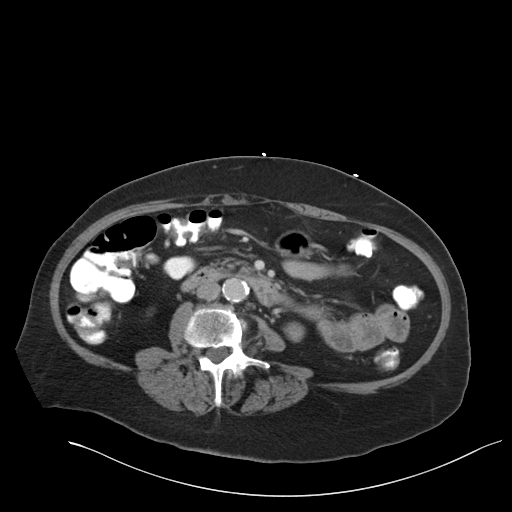
[im 56/89  soft-tissue]
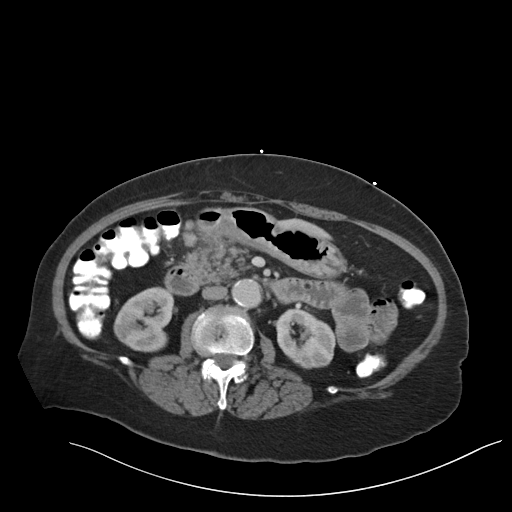
[im 56/89  bone]
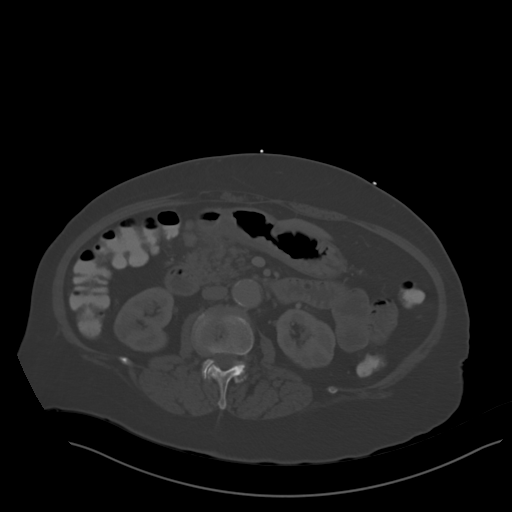
[im 65/89  soft-tissue]
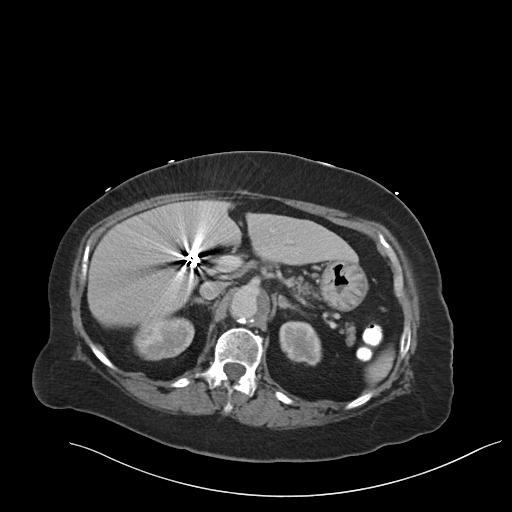
[im 70/89  soft-tissue]
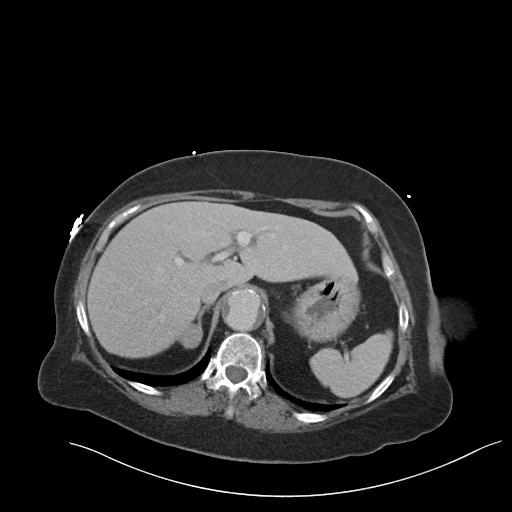
[im 75/89  soft-tissue]
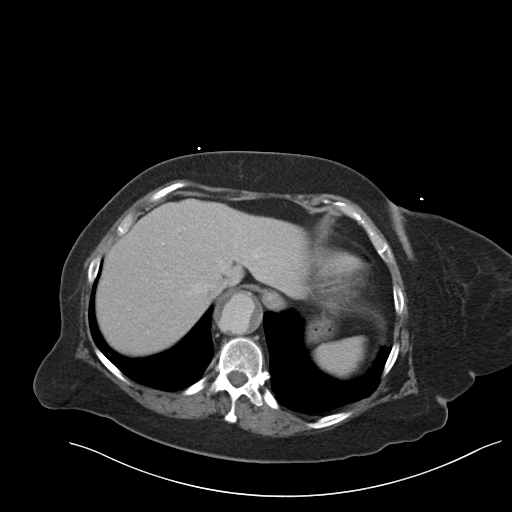
[im 84/89  soft-tissue]
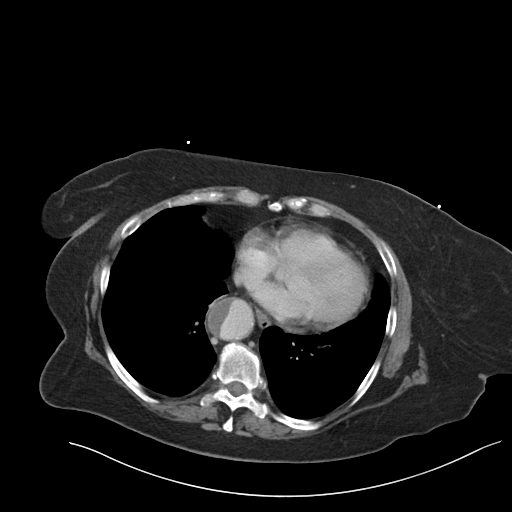

[Series 5: coronals · coronal · 0.78mm/px · 3 of 140 slices shown]
[im 47/140  soft-tissue]
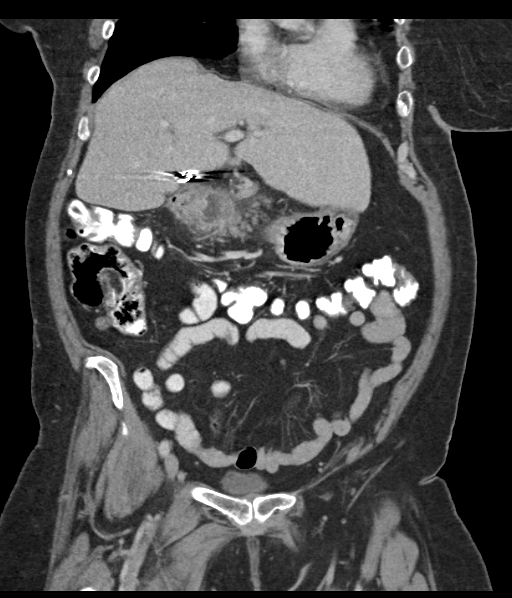
[im 62/140  soft-tissue]
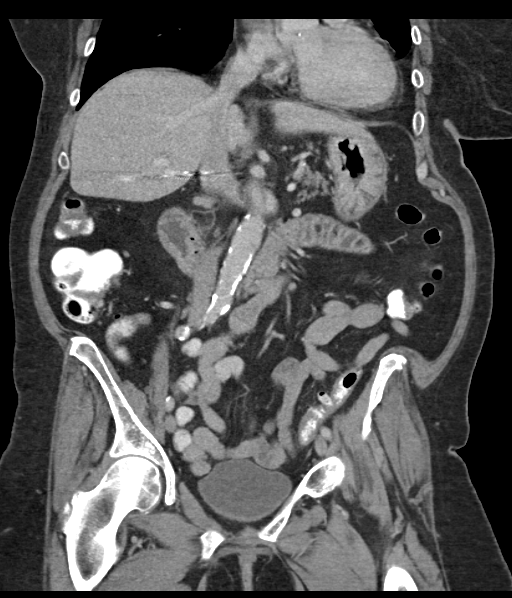
[im 78/140  soft-tissue]
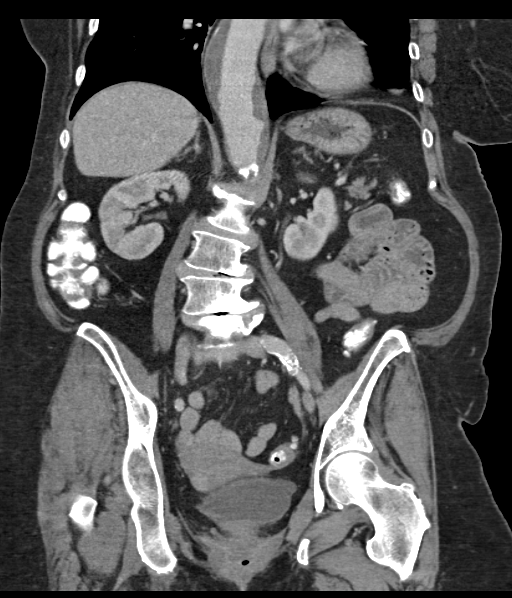

[16 of 46 positions shown; findings below may reference images not displayed]

FINDINGS: There is mild inflammatory stranding around the gastric pylorus and
duodenal bulb. This could represent peptic ulcer disease. There is
no extraluminal air. Remainder of the small bowel appears normal.
The appendix is normal. The colon is remarkable only for mild
uncomplicated diverticulosis.

There is prior cholecystectomy. The bile ducts appear normal. The
liver appears normal. The spleen, pancreas and kidneys appear
unremarkable. There is a 2.3 cm right adrenal mass which is
unchanged from 08/12/2011 and which has been characterized as a
benign adenoma on MRI. Left adrenal is normal.

There is a 12 mm right lateral uterine mass which likely represents
a benign fibroid. There are otherwise unremarkable appearances of
the uterus and ovaries.

There is moderate aneurysm enlargement of the distal thoracic aorta
with maximum AP diameter of 3.8 cm. The aorta tapers to 3.4 cm at
the celiac origin and 2.8 cm at the renal artery origins. It remains
normal in caliber throughout the remainder of its length with
moderate atherosclerotic calcification. In the lower thoracic aorta
and upper abdominal aorta there is prominent mural thrombus or
chronic dissection and this appears to be unchanged from the MRI of
08/12/2011.

No other significant abnormality is evident in the lower chest.

No significant skeletal lesions are evident. There is severe
degenerative disc disease and moderate facet arthritis in the lumbar
spine, with grade 1 degenerative-appearing anterolisthesis at L4-5.
IMPRESSION: 1. Mild inflammation around the pylorus and duodenal bulb,
suspicious for peptic ulcer disease. No extraluminal air.
2. Descending thoracic aortic aneurysm measuring 3.8 cm, tapering to
normal caliber in the upper abdomen. Prominent mural thrombus versus
chronic dissection of the lower thoracic aorta and upper abdominal
aorta, likely unchanged from 08/12/2011 although the caliber of the
aorta has probably enlarged since that time.
3. Unchanged benign right adrenal adenoma.
4. Probable 12 mm right lateral uterine fibroid.
5. Severe lumbar degenerative disc and facet disease.
